# Patient Record
Sex: Female | Born: 1970 | Race: White | Hispanic: No | Marital: Single | Smoking: Never smoker
Health system: Southern US, Community
[De-identification: ages and names within clinical notes are randomized; demographics above are authoritative.]

## PROBLEM LIST (undated history)

## (undated) ENCOUNTER — Inpatient Hospital Stay (HOSPITAL_COMMUNITY): Payer: Medicaid Other

## (undated) DIAGNOSIS — E162 Hypoglycemia, unspecified: Secondary | ICD-10-CM

## (undated) DIAGNOSIS — N289 Disorder of kidney and ureter, unspecified: Secondary | ICD-10-CM

## (undated) DIAGNOSIS — K589 Irritable bowel syndrome without diarrhea: Secondary | ICD-10-CM

## (undated) DIAGNOSIS — M1990 Unspecified osteoarthritis, unspecified site: Secondary | ICD-10-CM

## (undated) DIAGNOSIS — D649 Anemia, unspecified: Secondary | ICD-10-CM

## (undated) HISTORY — PX: TUBAL LIGATION: SHX77

## (undated) HISTORY — PX: MOUTH SURGERY: SHX715

## (undated) HISTORY — PX: ABDOMINAL HYSTERECTOMY: SHX81

## (undated) HISTORY — DX: Unspecified osteoarthritis, unspecified site: M19.90

## (undated) HISTORY — DX: Irritable bowel syndrome, unspecified: K58.9

---

## 2001-03-04 ENCOUNTER — Emergency Department (HOSPITAL_COMMUNITY): Admission: EM | Admit: 2001-03-04 | Discharge: 2001-03-04 | Payer: Self-pay | Admitting: Emergency Medicine

## 2001-07-28 ENCOUNTER — Other Ambulatory Visit: Admission: RE | Admit: 2001-07-28 | Discharge: 2001-07-28 | Payer: Self-pay | Admitting: Obstetrics and Gynecology

## 2003-05-28 ENCOUNTER — Emergency Department (HOSPITAL_COMMUNITY): Admission: EM | Admit: 2003-05-28 | Discharge: 2003-05-28 | Payer: Self-pay | Admitting: Emergency Medicine

## 2003-05-28 ENCOUNTER — Encounter: Payer: Self-pay | Admitting: Emergency Medicine

## 2005-07-14 ENCOUNTER — Emergency Department (HOSPITAL_COMMUNITY): Admission: EM | Admit: 2005-07-14 | Discharge: 2005-07-14 | Payer: Self-pay | Admitting: Emergency Medicine

## 2006-04-03 ENCOUNTER — Emergency Department (HOSPITAL_COMMUNITY): Admission: EM | Admit: 2006-04-03 | Discharge: 2006-04-03 | Payer: Self-pay | Admitting: Emergency Medicine

## 2007-04-24 ENCOUNTER — Ambulatory Visit: Payer: Self-pay | Admitting: Orthopedic Surgery

## 2007-06-04 ENCOUNTER — Emergency Department (HOSPITAL_COMMUNITY): Admission: EM | Admit: 2007-06-04 | Discharge: 2007-06-04 | Payer: Self-pay | Admitting: Emergency Medicine

## 2007-07-20 ENCOUNTER — Encounter: Payer: Self-pay | Admitting: Orthopedic Surgery

## 2007-07-20 ENCOUNTER — Ambulatory Visit (HOSPITAL_COMMUNITY): Admission: RE | Admit: 2007-07-20 | Discharge: 2007-07-20 | Payer: Self-pay | Admitting: Orthopaedic Surgery

## 2007-08-24 ENCOUNTER — Emergency Department (HOSPITAL_COMMUNITY): Admission: EM | Admit: 2007-08-24 | Discharge: 2007-08-24 | Payer: Self-pay | Admitting: Emergency Medicine

## 2007-09-27 ENCOUNTER — Inpatient Hospital Stay (HOSPITAL_COMMUNITY): Admission: RE | Admit: 2007-09-27 | Discharge: 2007-09-29 | Payer: Self-pay | Admitting: Obstetrics & Gynecology

## 2007-09-27 ENCOUNTER — Encounter: Payer: Self-pay | Admitting: Obstetrics & Gynecology

## 2009-12-18 DIAGNOSIS — J45909 Unspecified asthma, uncomplicated: Secondary | ICD-10-CM | POA: Insufficient documentation

## 2010-01-13 ENCOUNTER — Ambulatory Visit: Payer: Self-pay | Admitting: Orthopedic Surgery

## 2010-01-13 DIAGNOSIS — M2240 Chondromalacia patellae, unspecified knee: Secondary | ICD-10-CM | POA: Insufficient documentation

## 2010-01-13 DIAGNOSIS — M25569 Pain in unspecified knee: Secondary | ICD-10-CM | POA: Insufficient documentation

## 2010-01-16 ENCOUNTER — Telehealth (INDEPENDENT_AMBULATORY_CARE_PROVIDER_SITE_OTHER): Payer: Self-pay | Admitting: *Deleted

## 2010-01-26 ENCOUNTER — Encounter (HOSPITAL_COMMUNITY): Admission: RE | Admit: 2010-01-26 | Discharge: 2010-02-25 | Payer: Self-pay | Admitting: Orthopedic Surgery

## 2010-01-29 ENCOUNTER — Encounter: Payer: Self-pay | Admitting: Orthopedic Surgery

## 2010-04-15 ENCOUNTER — Encounter: Payer: Self-pay | Admitting: Orthopedic Surgery

## 2011-01-04 ENCOUNTER — Encounter: Payer: Self-pay | Admitting: Emergency Medicine

## 2011-01-12 NOTE — Miscellaneous (Signed)
Summary: PT order  PT order   Imported By: Cammie Sickle 01/14/2010 19:38:56  _____________________________________________________________________  External Attachment:    Type:   Image     Comment:   External Document

## 2011-01-12 NOTE — Progress Notes (Signed)
Summary: approved for celebrex  Phone Note Outgoing Call Call back at [phone]   Summary of Call: had to precert patient to have celebrex through [place] medicaid, approved for 6 months, faxed approval to [facility] in [place].

## 2011-01-12 NOTE — Progress Notes (Signed)
Summary: Office Visit  Office Visit   Imported By: Jacklynn Ganong 12/18/2009 16:25:43  _____________________________________________________________________  External Attachment:    Type:   Image     Comment:   External Document

## 2011-01-12 NOTE — Letter (Signed)
Summary: History form  History form   Imported By: Jacklynn Ganong 01/15/2010 16:23:46  _____________________________________________________________________  External Attachment:    Type:   Image     Comment:   External Document

## 2011-01-12 NOTE — Letter (Signed)
Summary: *Orthopedic Consult Note  [facility]  [address]  [place], [place] [address]   Phone: [phone]  Fax: [phone]    Re:    Bailey Mcdonald DOB:    12/11/1971   Dear: Dr. Regino Schultze   Thank you for requesting that we see the above patient for consultation.  A copy of the detailed office note will be sent under separate cover, for your review.  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  Physical therapy for 6 weeks.       Thank you for this opportunity to look after your patient.  Sincerely,   Terrance Mass. MD.

## 2011-01-12 NOTE — Medication Information (Signed)
Summary: Tax adviser   Imported By: Cammie Sickle 01/14/2010 19:35:13  _____________________________________________________________________  External Attachment:    Type:   Image     Comment:   External Document

## 2011-01-12 NOTE — Assessment & Plan Note (Signed)
Summary: KNEE PAIN RT>LT/NO RECENT XR /SARAH BRUCE/BSF   Visit Type:  Initial Consult Referring Provider:  Dr. Regino Schultze  CC:  right knee pain.  History of Present Illness: this is a 40 year old female who's had pain in her knee for over 7 years without any major injury.  No previous surgery.  She has anterior knee pain which was partially and adequately relieved by Celebrex but insurance would not continue to cover it.  She was switched to Mobic which burned her stomach and she also fell naproxen therapy  She has anterior knee pain with slight medial radiation.  There is crepitation noted on range of motion and she has difficulty going down stairs.    Allergies: No Known Drug Allergies  Past History:  Past Medical History: Last updated: 12/18/2009 Asthma  Past Surgical History: Last updated: 12/18/2009 Oral surgery Tubal  Review of Systems Resp:  Denies short of breath, difficulty breathing, COPD, cough, and pneumonia; wheezing. MS:  Complains of joint pain and joint swelling; denies rheumatoid arthritis, gout, bone cancer, osteoporosis, and ; instability,redness,heat.  The review of systems is negative for General, Cardiac , GI, GU, Neuro, Endo, Psych, Derm, EENT, Immunology, and Lymphatic.  Physical Exam  Msk:  vital signs are stable  The patient is well developed and nourished, with normal grooming and hygiene. The body habitus is  Endomorphic Pulses:  The pulses and perfusion were normal with normal color, temperature  and no swelling  Extremities:  lower extremities  Gait pattern is normal  Inspection of both knees normal  Range of motion both knees normal  Motor exam normal in both lower extremities  Both knees stable.  RIGHT knee crepitance with positive patellofemoral compression test negative on the LEFT with mild crepitus  Both hips painless range of motion  Leg lengths were equal.   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 1 q day  Orders: New Patient Level III (30865)  Problem # 2:  KNEE PAIN (ICD-719.46)  Her updated medication list for this problem includes:    Celebrex 200 Mg Caps (Celecoxib) .Marland Kitchen... 1 q day [facility] and lateral and oblique films of the knee showed no abnormalities  She also had a MRI of the knee which showed some fluid in chondromalacia patella no meniscal tears  Recommend physical therapy, Celebrex resubmitted for insurance clearance based on failure of nonsteroidal anti-inflammatories  Orders: New Patient Level III (78469)  Medications Added to Medication List This Visit: 1)  Celebrex 200 Mg Caps (Celecoxib) .Marland Kitchen.. 1 q day  Patient Instructions: 1)  You have received an injection of cortisone today. You may experience increased pain at the injection site. Apply ice pack to the area for 20 minutes every 2 hours and take 2 xtra strength tylenol every 8 hours. This increased pain will usually resolve in 24 hours. The injection will take effect in 3-10 days.  2)  pt x 6 weeks  3)  Please schedule a follow-up appointment as needed. Prescriptions: CELEBREX 200 MG CAPS (CELECOXIB) 1 q day  #90 x 5   Entered and Authorized by:   [name] MD  Signed by:   [name] MD on 01/13/2010   Method used:   Handwritten   RxID:   [id number]

## 2011-01-12 NOTE — Miscellaneous (Signed)
Summary: Discharged from Rehab  Discharged from Rehab   Imported By: Jacklynn Ganong 04/20/2010 10:18:57  _____________________________________________________________________  External Attachment:    Type:   Image     Comment:   External Document

## 2011-01-12 NOTE — Miscellaneous (Signed)
Summary: PT Initial evaluatin  PT Initial evaluatin   Imported By: Jacklynn Ganong 01/29/2010 16:48:07  _____________________________________________________________________  External Attachment:    Type:   Image     Comment:   External Document

## 2011-01-14 ENCOUNTER — Ambulatory Visit: Admit: 2011-01-14 | Payer: Self-pay | Admitting: Gastroenterology

## 2011-01-14 ENCOUNTER — Ambulatory Visit: Payer: Self-pay | Admitting: Gastroenterology

## 2011-04-27 NOTE — Discharge Summary (Signed)
NAMELorelle Mcdonald               ACCOUNT NO.:  [id number]   MEDICAL RECORD NO.:  [id number]          PATIENT TYPE:  INP   LOCATION:  A320                          FACILITY:  [facility]   PHYSICIAN:  Lazaro Arms, M.D.   DATE OF BIRTH:  Jan 26, 1971   DATE OF ADMISSION:  09/27/2007  DATE OF DISCHARGE:  10/17/2008LH                               DISCHARGE SUMMARY   DISCHARGE DIAGNOSIS:  1. Status post abdominal hysteroscopy and bilateral salpingo-      oophorectomy.  2. Unremarkable postoperative course.   PROCEDURE:  TAH and BSO.   Please refer to the history and physical as well as the operative report  for details of admission to the hospital.   HOSPITAL COURSE:  The patient was admitted postoperatively.  Intraoperative course was unremarkable.  She tolerated clear liquids and  a regular diet.  She voided without symptoms and was extensively  ambulatory.  Her abdominal examination was benign.  Her incision was  clean, dry, and intact.  Her hemoglobin and hematocrit were good  postoperative at 11 and 33, white count 10,000.  She was discharged to  home on Percocet, Motrin, and Cipro.  She is given instructions and  precautions to return to the office prior to her scheduled appointment  which is October 04, 2007.      Lazaro Arms, M.D.  Electronically Signed     LHE/MEDQ  D:  09/29/2007  T:  09/30/2007  Job:  [id number]

## 2011-04-27 NOTE — Op Note (Signed)
NAMELorelle Mcdonald               ACCOUNT NO.:  [id number]   MEDICAL RECORD NO.:  [id number]          PATIENT TYPE:  INP   LOCATION:  A320                          FACILITY:  [facility]   PHYSICIAN:  Lazaro Arms, M.D.   DATE OF BIRTH:  1971-04-06   DATE OF PROCEDURE:  09/27/2007  DATE OF DISCHARGE:                               OPERATIVE REPORT   PREOPERATIVE DIAGNOSES:  1. Right ovarian cyst.  2. Enlarged fibroid uterus.  3. Menometrorrhagia.   POSTOPERATIVE DIAGNOSES:  1. Right ovarian cyst.  2. Enlarged fibroid uterus.  3. Menometrorrhagia.   PROCEDURE:  Total abdominal hysterectomy/bilateral salpingo-  oophorectomy.   SURGEON:  Lazaro Arms, M.D.   ANESTHESIA:  General endotracheal.   FINDINGS:  Patient had a large simple cystic lesion of the right ovary.  It was measured by CT scan of 12 x 6 x 5, and that was consistent.  There were no peritoneal abnormalities otherwise, no omental studding,  peritoneal studding.  There were no excrescences.  The left ovary was  fine.  This appeared to be a benign lesion.  She had an enlarged fibroid  uterus.   DESCRIPTION OF OPERATION:  Patient was taken to the operating room,  placed in a supine position.  Underwent general endotracheal anesthesia.  Prepped and draped in the usual sterile fashion.  Foley catheter was  placed.  A Pfannenstiel skin incision was made, carried down sharply to  the rectus fascia, scored in the midline, extended laterally.  Fascia  was taken off the muscle superiorly and inferiorly without difficulty.  The muscles were divided.  The peritoneal cavity was entered.  The right  ovary was identified, and it was removed.  The pedicle was clamped, cut,  and double suture ligated with good hemostasis.  I then placed an Alexis  self-retaining wound retractor.  The upper abdomen was packed away.  The  uterine cornua were grasped.  The left round ligament was suture ligated  and cut. The right round ligament was  suture ligated and cut.  The  infundibulopelvic ligament on the right was clamped, cut, and double  suture ligated.  The infundibulopelvic ligament on the left was clamped,  cut, and double suture ligated.  Vesicouterine serosal flap was created  and pushed off the lower uterine segment.  The uterine vessels were  clamped, cut, and suture ligated.  Serial pedicles were taken down the  cervix, each pedicle being clamped, cut, and suture ligated.  Through  the cardinal ligament, the vagina was cross-clamped.  Specimen was  removed.  Vaginal angle sutures placed.  The vagina was closed with  interrupted figure-of-eight sutures.  The pelvis was irrigated  vigorously.  All pedicles were found to be hemostatic.  Interseed was  placed to prevent postoperative adhesions.  An Alexis wound retractor  was removed as well as the packs, after the pelvis was irrigated  vigorously.  The muscles and peritoneum were reapproximated loosely.  The fascia was closed using 0 Vicryl running  subcutaneous tissues, made hemostatic and irrigated.  The skin was  closed using skin staples.  The  patient tolerated the procedure well.  She experienced 150 cc of blood loss, was taken to the recovery room in  good, stable condition.  All counts were correct x3.      Lazaro Arms, M.D.  Electronically Signed     LHE/MEDQ  D:  09/27/2007  T:  09/27/2007  Job:  [id number]

## 2011-04-29 ENCOUNTER — Other Ambulatory Visit (HOSPITAL_COMMUNITY): Payer: Self-pay | Admitting: Internal Medicine

## 2011-04-29 ENCOUNTER — Encounter (HOSPITAL_COMMUNITY): Payer: Self-pay

## 2011-04-29 ENCOUNTER — Ambulatory Visit (HOSPITAL_COMMUNITY)
Admission: RE | Admit: 2011-04-29 | Discharge: 2011-04-29 | Disposition: A | Discharge status: Home / Self Care | Payer: Medicaid Other | Source: Ambulatory Visit | Attending: Internal Medicine | Admitting: Internal Medicine

## 2011-04-29 DIAGNOSIS — R0789 Other chest pain: Secondary | ICD-10-CM | POA: Insufficient documentation

## 2011-04-29 DIAGNOSIS — R079 Chest pain, unspecified: Secondary | ICD-10-CM

## 2011-05-14 HISTORY — PX: KNEE SURGERY: SHX244

## 2011-05-19 ENCOUNTER — Encounter: Payer: Self-pay | Admitting: Orthopedic Surgery

## 2011-05-19 ENCOUNTER — Ambulatory Visit (INDEPENDENT_AMBULATORY_CARE_PROVIDER_SITE_OTHER): Payer: Medicaid Other | Admitting: Orthopedic Surgery

## 2011-05-19 DIAGNOSIS — M2240 Chondromalacia patellae, unspecified knee: Secondary | ICD-10-CM

## 2011-05-19 DIAGNOSIS — M2241 Chondromalacia patellae, right knee: Secondary | ICD-10-CM | POA: Insufficient documentation

## 2011-05-19 NOTE — Progress Notes (Signed)
History and physical  Office visit note  CC: right knee pain.  History of Present Illness:  this is a 40 year old female who's had pain in her knee for over 7 years without any major injury. No previous surgery. She has anterior knee pain which was partially and adequately relieved by Celebrex but insurance would not continue to cover it. She was switched to Mobic which burned her stomach and she also failed naproxen therapy  She has anterior knee pain with slight medial radiation. There is crepitation noted on range of motion and she has difficulty going down stairs. Had increasing pain and things feel like they are getting worse.  She still has a crepitation which seems to be getting worse and the knee seemed to give out and has some catching in it.  She failed nonoperative treat.  We discussed possible repeat physical therapy which she is already done, repeat injection or continue anti-inflammatories but at this point I think she has failed a reasonable course of nonoperative therapy.  She understands she may have some anterior knee pain after this but she is willing to take that risk.   Allergies:  No Known Drug Allergies  Past History:  Past Medical History:    Asthma  Past Surgical History:    Oral surgery  Tubal   Review of Systems  Resp: Denies short of breath, difficulty breathing, COPD, cough, and pneumonia; wheezing.  MS: Complains of joint pain and joint swelling; denies rheumatoid arthritis, gout, bone cancer, osteoporosis, and ; instability,redness,heat.  The review of systems is negative for General, Cardiac , GI, GU, Neuro, Endo, Psych, Derm, EENT, Immunology, and Lymphatic.   Physical Exam  Msk: vital signs are stable  The patient is well developed and nourished, with normal grooming and hygiene. The body habitus is Endomorphic  Pulses: The pulses and perfusion were normal with normal color, temperature and no swelling  Extremities: lower extremities  Gait pattern  is normal  Inspection of both knees normal  Range of motion both knees normal  Motor exam normal in both lower extremities  Both knees stable.  RIGHT knee crepitance with positive patellofemoral compression test negative on the LEFT with mild crepitus  Both hips painless range of motion  Leg lengths were equal.  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  This procedure has been fully reviewed with the patient and written informed consent has been obtained.

## 2011-05-19 NOTE — Progress Notes (Signed)
A separate identifiable x-ray report 3 views RIGHT knee  Reason pain knee  AP lateral patellofemoral view shows normal alignment, no significant joint space narrowing.  No fracture dislocation.  Impression normal knee

## 2011-05-26 ENCOUNTER — Telehealth: Payer: Self-pay | Admitting: Orthopedic Surgery

## 2011-05-26 NOTE — Telephone Encounter (Signed)
Bailey Mcdonald is scheduled for right knee surgery 06/04/11.  Has received a notice to serve on jury duty starting 06/21/11.  She asked if you would write a letter asking for her to be excused due to the scheduled surgery.

## 2011-05-27 ENCOUNTER — Encounter: Payer: Self-pay | Admitting: Orthopedic Surgery

## 2011-05-27 NOTE — Telephone Encounter (Signed)
Write letter

## 2011-05-27 NOTE — Telephone Encounter (Signed)
Requested letter to be excused from jury duty approved and written.  Notified patient, left message [phone].

## 2011-05-31 ENCOUNTER — Telehealth: Payer: Self-pay | Admitting: Orthopedic Surgery

## 2011-05-31 ENCOUNTER — Encounter (HOSPITAL_COMMUNITY)
Admission: RE | Admit: 2011-05-31 | Discharge: 2011-05-31 | Disposition: A | Discharge status: Home / Self Care | Payer: Medicaid Other | Source: Ambulatory Visit | Attending: Orthopedic Surgery | Admitting: Orthopedic Surgery

## 2011-05-31 LAB — BASIC METABOLIC PANEL
CO2: 30 mEq/L (ref 19–32)
Chloride: 105 mEq/L (ref 96–112)
Potassium: 4.1 mEq/L (ref 3.5–5.1)
Sodium: 141 mEq/L (ref 135–145)

## 2011-05-31 LAB — SURGICAL PCR SCREEN
MRSA, PCR: NEGATIVE
Staphylococcus aureus: NEGATIVE

## 2011-05-31 LAB — HEMOGLOBIN AND HEMATOCRIT, BLOOD: HCT: 42 % (ref 36.0–46.0)

## 2011-05-31 NOTE — Telephone Encounter (Signed)
Contacted insurer's (Medicaid) authorization contact, [employer], ph [phone] re: pre-certification information, out-patient surgery scheduled 06/04/11 at [facility]. Per automated system, CPT codes 09811, [phone], [phone], no pre-authorization required.

## 2011-06-04 ENCOUNTER — Ambulatory Visit (HOSPITAL_COMMUNITY)
Admission: RE | Admit: 2011-06-04 | Discharge: 2011-06-04 | Disposition: A | Discharge status: Home / Self Care | Payer: Medicaid Other | Source: Ambulatory Visit | Attending: Orthopedic Surgery | Admitting: Orthopedic Surgery

## 2011-06-04 DIAGNOSIS — Z01812 Encounter for preprocedural laboratory examination: Secondary | ICD-10-CM | POA: Insufficient documentation

## 2011-06-04 DIAGNOSIS — M2240 Chondromalacia patellae, unspecified knee: Secondary | ICD-10-CM | POA: Insufficient documentation

## 2011-06-04 DIAGNOSIS — M1710 Unilateral primary osteoarthritis, unspecified knee: Secondary | ICD-10-CM

## 2011-06-07 ENCOUNTER — Ambulatory Visit (INDEPENDENT_AMBULATORY_CARE_PROVIDER_SITE_OTHER): Payer: Medicaid Other | Admitting: Orthopedic Surgery

## 2011-06-07 DIAGNOSIS — M2240 Chondromalacia patellae, unspecified knee: Secondary | ICD-10-CM

## 2011-06-07 NOTE — Progress Notes (Signed)
   Postop visit #1  Date of surgery June 22  Postop day 3  [facility]  Chondroplasty patella for chondromalacia grade 3  The knee looks good with minimal swelling however she only has about 60 of flexion.  Pain medication Norco 7.5  Advised her to continue heel slides and start therapy on Thursday for followup 3 weeks

## 2011-06-07 NOTE — Patient Instructions (Signed)
Start PT [facility]  Come back in 3 weeks recheck right knee post op 2

## 2011-06-10 ENCOUNTER — Ambulatory Visit (HOSPITAL_COMMUNITY)
Admit: 2011-06-10 | Discharge: 2011-06-10 | Disposition: A | Discharge status: Home / Self Care | Payer: Medicaid Other | Source: Ambulatory Visit | Attending: Orthopedic Surgery | Admitting: Orthopedic Surgery

## 2011-06-10 DIAGNOSIS — M6281 Muscle weakness (generalized): Secondary | ICD-10-CM | POA: Insufficient documentation

## 2011-06-10 DIAGNOSIS — M25569 Pain in unspecified knee: Secondary | ICD-10-CM | POA: Insufficient documentation

## 2011-06-10 DIAGNOSIS — R262 Difficulty in walking, not elsewhere classified: Secondary | ICD-10-CM | POA: Insufficient documentation

## 2011-06-10 DIAGNOSIS — M25669 Stiffness of unspecified knee, not elsewhere classified: Secondary | ICD-10-CM | POA: Insufficient documentation

## 2011-06-10 DIAGNOSIS — IMO0001 Reserved for inherently not codable concepts without codable children: Secondary | ICD-10-CM | POA: Insufficient documentation

## 2011-06-18 ENCOUNTER — Ambulatory Visit (HOSPITAL_COMMUNITY): Payer: Medicaid Other | Admitting: *Deleted

## 2011-06-24 ENCOUNTER — Telehealth (HOSPITAL_COMMUNITY): Payer: Self-pay | Admitting: *Deleted

## 2011-06-24 ENCOUNTER — Ambulatory Visit (HOSPITAL_COMMUNITY): Payer: Medicaid Other | Admitting: *Deleted

## 2011-06-28 NOTE — Op Note (Signed)
  NAMELorelle Mcdonald               ACCOUNT NO.:  [id number]  MEDICAL RECORD NO.:  [id number]  LOCATION:  DAYP                          FACILITY:  [facility]  PHYSICIAN:  Vickki Hearing, M.D.DATE OF BIRTH:  06/13/1971  DATE OF PROCEDURE:  06/04/2011 DATE OF DISCHARGE:                              OPERATIVE REPORT   HISTORY:  This is a 40 year old female with long history of the anterior knee pain, confirmed by MRI.  She was treated with anti-inflammatories, analgesics, physical therapy, activity modification, and continued to have anterior knee pain with crepitance.  She was counseled about her disease process given various options including nonoperative treatment versus debridement of the patella.  She opted for surgical debridement.  She understood that she may continued to have some anterior knee pain as debrided cartilage will not grow back.  She was willing to take that her risk.  PREOPERATIVE DIAGNOSIS:  Chondromalacia patella, right knee.  POSTOPERATIVE DIAGNOSIS:  Chondromalacia patella, right knee.  PROCEDURE IN DETAIL:  Arthroscopy, right knee and chondroplasty of the patella.  SURGEON:  Vickki Hearing, MD  ASSISTANTS:  None.  ANESTHESIA:  General with LMA.  OPERATIVE FINDINGS:  Chondromalacia primarily at the lateral facet. Mild chondromalacia of the medial facet.  Trochlear normal.  Medial and lateral compartments normal.  ACL and PCL normal.  DETAILS OF PROCEDURE:  The right knee was marked as the surgical site, countersigned by the surgeon.  During the preoperative update, the patient had a cat scratch which was approximately 6 days old on the lateral side of the knee proximal to the patella.  This was noted and marked.  Chart updating was completed and she was taken to surgery.  She had a gram of antibiotics.  She was intubated with an LMA.  Her right leg was placed in a well leg holder.  The left leg was placed in a padded leg holder.  After  sterile prep and drape, the lateral portal was established.  The scope was introduced into the medial compartment. Diagnostic arthroscopy commenced there.  After completing the diagnostic arthroscopy and finding the chondromalacia of the patella, we placed a medial portal using a spinal needle to access the patellofemoral cartilage.  We then placed a shaver and did a debridement of the patella.  We then placed a [employer] ArthroCare wand and completed the chondroplasty.  We irrigated the knee twice and did a repeat diagnostic arthroscopy to evaluate the menisci one more time.  They were found to be normal.  We closed with 3-0 nylon and injected 60 mL of Marcaine into the joint. We applied sterile dressings, activated the cryo cuff over an Ace bandage and then the patient was extubated and taken to recovery room in stable condition.  She will follow up on Monday.  She was given some Norco 7.5 mg for pain.     Vickki Hearing, M.D.     SEH/MEDQ  D:  06/04/2011  T:  06/05/2011  Job:  [id number]  Electronically Signed by [name] M.D. on 06/28/2011 05:53:31 PM

## 2011-06-30 ENCOUNTER — Ambulatory Visit (INDEPENDENT_AMBULATORY_CARE_PROVIDER_SITE_OTHER): Payer: Medicaid Other | Admitting: Orthopedic Surgery

## 2011-06-30 DIAGNOSIS — M2240 Chondromalacia patellae, unspecified knee: Secondary | ICD-10-CM

## 2011-06-30 NOTE — Patient Instructions (Signed)
START CARTILAGE SUPPLEMENTS  Glucosamine Chondroitin   OR   OSTEOBIFLEX

## 2011-06-30 NOTE — Progress Notes (Signed)
  Postop visit    Procedure Arthroscopy RIGHT knee chondroplasty patella  Diagnosis Patella chondral malacia  Findings Degeneration of the cartilage with grade 2 and 3 changes of the patellofemoral cartilage  Pain medication Not applicable  Appearance of knee The knee looks good today.  There is still crepitance but no pain with range of motion.  Physical therapy will be done at  [facility]   Patient complaints Stiffness  Plan Return for evaluation both knees LEFT knee started to bother her.  Recommended activity modification, cartilage supplementation .

## 2011-07-01 ENCOUNTER — Inpatient Hospital Stay (HOSPITAL_COMMUNITY): Admission: RE | Admit: 2011-07-01 | Payer: Medicaid Other | Source: Ambulatory Visit | Admitting: *Deleted

## 2011-08-11 ENCOUNTER — Ambulatory Visit: Payer: Medicaid Other | Admitting: Orthopedic Surgery

## 2011-09-09 ENCOUNTER — Ambulatory Visit (INDEPENDENT_AMBULATORY_CARE_PROVIDER_SITE_OTHER): Payer: Medicaid Other | Admitting: Orthopedic Surgery

## 2011-09-09 ENCOUNTER — Encounter: Payer: Self-pay | Admitting: Orthopedic Surgery

## 2011-09-09 DIAGNOSIS — M2240 Chondromalacia patellae, unspecified knee: Secondary | ICD-10-CM

## 2011-09-09 DIAGNOSIS — M1710 Unilateral primary osteoarthritis, unspecified knee: Secondary | ICD-10-CM

## 2011-09-09 NOTE — Progress Notes (Signed)
Problem LEFT knee pain  The patient is status post arthroscopy RIGHT knee with patellar chondroplasty for chondromalacia of the patella in June of 2012.  Presents now with pain and crepitance in the LEFT knee with difficulty kneeling and climbing squatting bending.  She would like to proceed with LEFT knee arthroscopy and debridement.  Review of systems: No history of fever chills or fatigue, eyes no blurred vision, no difficulty swallowing, no chest pain, no shortness of breath, no heartburn, no frequency, no itching or rash, no numbness or tingling, no nervousness, no easy bleeding no excessive thirst and no food reactions.  Past Medical History  Diagnosis Date  . Asthma   . Arthritis   . IBS (irritable bowel syndrome)     Past Surgical History  Procedure Date  . Mouth surgery   . Tubal ligation   . Knee surgery 05/2011    rtight knee scope chondroplasty patella    History   Social History  . Marital Status: Single    Spouse Name: N/A    Number of Children: N/A  . Years of Education: N/A   Occupational History  . property management    Social History Main Topics  . Smoking status: Never Smoker   . Smokeless tobacco: Not on file  . Alcohol Use: No  . Drug Use: No  . Sexually Active: Not on file   Other Topics Concern  . Not on file   Social History Narrative  . No narrative on file     Exam we have stable vital signs We have normal appearance and endomorphic body habitus We are oriented x3 today with pleasant mood  We have normal gait analysis  The LEFT knee is tender and painful in the patella with patellofemoral crepitance, range of motion is normal, the knee is stable, the patella has good mobility  The strength in the knee is normal the skin is intact.  Her upper extremities show no contracture subluxation atrophy tremor or malalignment  All 4 limbs have normal scan  Pulse and temperature are normal in the extremities without edema swelling or  varicosities.  There is no lymphadenopathy.  Sensation remains intact.  There are no pathologic reflexes  Coordination and balance are normal  Imaging 3 views of the knee show normal patellofemoral alignment normal knee alignment and no arthritis in the tibial femoral articulation  Assessment patellofemoral chondromalacia.  We both agree that nonoperative treatment is not warranted at this time.  We tried that with her other knee and it didn't work her knee got worse and she experience quite a bit of pain and discomfort and loss of function prior to the surgery  She also has a ganglion cyst on the LEFT wrist and is considering having that removed and we would send her to Dr. Malvin Johns for that.   Plan arthroscopy LEFT knee chondroplasty of the patella.

## 2011-09-09 NOTE — Patient Instructions (Signed)
You have been scheduled for surgery.  All surgeries carry some risk.  Remember you always have the option of continued nonsurgical treatment. However in this situation the risks vs. the benefits favor surgery as the best treatment option. The risks of the surgery includes the following but is not limited to bleeding, infection, pulmonary embolus, death from anesthesia, nerve injury vascular injury or need for further surgery, continued pain.  Specific to this procedure the following risks and complications are rare but possible  Pain  Swelling  Crepitance (knee makes noise)

## 2011-09-13 ENCOUNTER — Encounter (HOSPITAL_COMMUNITY)
Admission: RE | Admit: 2011-09-13 | Discharge: 2011-09-13 | Disposition: A | Discharge status: Home / Self Care | Payer: Medicaid Other | Source: Ambulatory Visit | Attending: Orthopedic Surgery | Admitting: Orthopedic Surgery

## 2011-09-13 ENCOUNTER — Encounter (HOSPITAL_COMMUNITY): Payer: Self-pay

## 2011-09-13 HISTORY — DX: Anemia, unspecified: D64.9

## 2011-09-13 HISTORY — DX: Hypoglycemia, unspecified: E16.2

## 2011-09-13 LAB — DIFFERENTIAL
Basophils Absolute: 0.1 10*3/uL (ref 0.0–0.1)
Basophils Relative: 1 % (ref 0–1)
Eosinophils Absolute: 0.3 10*3/uL (ref 0.0–0.7)
Lymphs Abs: 2.4 10*3/uL (ref 0.7–4.0)
Neutrophils Relative %: 56 % (ref 43–77)

## 2011-09-13 LAB — CBC
MCH: 30 pg (ref 26.0–34.0)
MCHC: 34.2 g/dL (ref 30.0–36.0)
Platelets: 258 10*3/uL (ref 150–400)
RBC: 4.96 MIL/uL (ref 3.87–5.11)
RDW: 12.4 % (ref 11.5–15.5)

## 2011-09-13 LAB — BASIC METABOLIC PANEL
GFR calc Af Amer: >90 mL/min (ref >90)
GFR calc non Af Amer: 88 mL/min — ABNORMAL LOW (ref >90)
Potassium: 4.1 mEq/L (ref 3.5–5.1)
Sodium: 140 mEq/L (ref 135–145)

## 2011-09-13 LAB — SURGICAL PCR SCREEN
MRSA, PCR: NEGATIVE
Staphylococcus aureus: NEGATIVE

## 2011-09-13 NOTE — Patient Instructions (Addendum)
18 Bailey Mcdonald  09/13/2011   Your procedure is scheduled on:  09/17/2011  Report to [facility] at  1000  AM.  Call this number if you have problems the morning of surgery: [phone]   Remember:   Do not eat food:After Midnight.  Do not drink clear liquids: After Midnight.  Take these medicines the morning of surgery with A SIP OF WATER: singulair,bentyl. Take albuterol before you come.   Do not wear jewelry, make-up or nail polish.  Do not wear lotions, powders, or perfumes. You may wear deodorant.  Do not shave 48 hours prior to surgery.  Do not bring valuables to the hospital.  Contacts, dentures or bridgework may not be worn into surgery.  Leave suitcase in the car. After surgery it may be brought to your room.  For patients admitted to the hospital, checkout time is 11:00 AM the day of discharge.   Patients discharged the day of surgery will not be allowed to drive home.  Name and phone number of your driver: family  Special Instructions: CHG Shower Use Special Wash: 1/2 bottle night before surgery and 1/2 bottle morning of surgery.   Please read over the following fact sheets that you were given: Pain Booklet, MRSA Information, Surgical Site Infection Prevention, Anesthesia Post-op Instructions and Care and Recovery After Surgery PATIENT INSTRUCTIONS POST-ANESTHESIA  IMMEDIATELY FOLLOWING SURGERY:  Do not drive or operate machinery for the first twenty four hours after surgery.  Do not make any important decisions for twenty four hours after surgery or while taking narcotic pain medications or sedatives.  If you develop intractable nausea and vomiting or a severe headache please notify your doctor immediately.  FOLLOW-UP:  Please make an appointment with your surgeon as instructed. You do not need to follow up with anesthesia unless specifically instructed to do so.  WOUND CARE INSTRUCTIONS (if applicable):  Keep a dry clean dressing on the anesthesia/puncture wound site if  there is drainage.  Once the wound has quit draining you may leave it open to air.  Generally you should leave the bandage intact for twenty four hours unless there is drainage.  If the epidural site drains for more than 36-48 hours please call the anesthesia department.  QUESTIONS?:  Please feel free to call your physician or the hospital operator if you have any questions, and they will be happy to assist you.     [facility] Anesthesia Department [address] [place] [phone]

## 2011-09-15 ENCOUNTER — Telehealth: Payer: Self-pay | Admitting: Orthopedic Surgery

## 2011-09-15 NOTE — Telephone Encounter (Signed)
Contacted Medicaid's contracted authorization contact [employer], ph [phone], re: out-patient surgery scheduled 09/17/11 at [facility] [place] [address], [phone].  No prior authorization required per automated voice response system.

## 2011-09-16 ENCOUNTER — Telehealth: Payer: Self-pay | Admitting: Orthopedic Surgery

## 2011-09-16 NOTE — Telephone Encounter (Signed)
Patient called to relay that she needs to cancel surgery for tomorrow, 09/17/11; states has "come down with whatever bug is going around."  She said she can re-schedule for next Friday or whatever is recommended.  Her home ph# is [phone].

## 2011-09-16 NOTE — Telephone Encounter (Signed)
Dr. Romeo Apple called Day Surgery and gave message to Selena Batten about the cancellation due to illness.  I also spoke with Selena Batten and she will move up last case into this surgery slot.  Marchelle Folks is needing a call back about the possible re-schedule for next Friday.  Selena Batten said she would be okay with same pre-op if she has the surgery within the next 2 weeks.  Pt's ph is [phone].

## 2011-09-17 ENCOUNTER — Ambulatory Visit (HOSPITAL_COMMUNITY): Admission: RE | Admit: 2011-09-17 | Payer: Medicaid Other | Source: Ambulatory Visit | Admitting: Orthopedic Surgery

## 2011-09-17 ENCOUNTER — Telehealth: Payer: Self-pay | Admitting: *Deleted

## 2011-09-17 ENCOUNTER — Encounter (HOSPITAL_COMMUNITY): Admission: RE | Payer: Self-pay | Source: Ambulatory Visit

## 2011-09-17 SURGERY — ARTHROSCOPY, KNEE
Anesthesia: Choice | Site: Knee | Laterality: Left

## 2011-09-17 NOTE — Telephone Encounter (Signed)
Called patient, lmom to call back here

## 2011-09-17 NOTE — Telephone Encounter (Signed)
I rescheduled surgery for 09/24/11 was initially scheduled for 09/17/11 patient was sick, she will have a phone interview for preop since she had preop on 09/13/11 at the hospital, her post op appt is for 09/27/11 at 330, I left patient a message on machine with all of this information.

## 2011-09-20 ENCOUNTER — Ambulatory Visit: Payer: Medicaid Other | Admitting: Orthopedic Surgery

## 2011-09-21 ENCOUNTER — Encounter (HOSPITAL_COMMUNITY): Payer: Self-pay

## 2011-09-21 ENCOUNTER — Encounter (HOSPITAL_COMMUNITY)
Admission: RE | Admit: 2011-09-21 | Discharge: 2011-09-21 | Disposition: A | Discharge status: Home / Self Care | Payer: Medicaid Other | Source: Ambulatory Visit | Attending: Orthopedic Surgery | Admitting: Orthopedic Surgery

## 2011-09-22 LAB — DIFFERENTIAL
Basophils Absolute: 0
Basophils Relative: 0
Eosinophils Absolute: 0.2
Eosinophils Relative: 2
Lymphocytes Relative: 14
Lymphs Abs: 1.6
Monocytes Absolute: 0.8 — ABNORMAL HIGH
Neutro Abs: 7.8 — ABNORMAL HIGH
Neutrophils Relative %: 76

## 2011-09-22 LAB — CBC
Hemoglobin: 11.1 — ABNORMAL LOW
MCHC: 34
Platelets: 209
Platelets: 216
RDW: 13.3
RDW: 13.3
WBC: 10.9 — ABNORMAL HIGH

## 2011-09-22 NOTE — H&P (Signed)
Description: 40 year old female  Provider: [name], MD  Department: [facility]        Diagnoses     Chondromalacia of patella     717.7    Patellofemoral arthritis     716.96      Reason for Visit   PRE-OP   eval leftl knee  [facility]ARK 06/04/11, needs left knee xray today.       Reason For Visit History Recorded        Vitals - Last Recorded       Ht Wt BMI    5\' 7"  (1.702 m)  220 lb (99.791 kg)  34.46 kg/m2          Progress Notes     [name], MD  09/09/2011  3:00 PM  Signed Problem LEFT knee pain   The patient is status post arthroscopy RIGHT knee with patellar chondroplasty for chondromalacia of the patella in June of 2012.   Presents now with pain and crepitance in the LEFT knee with difficulty kneeling and climbing squatting bending.   She would like to proceed with LEFT knee arthroscopy and debridement.   Review of systems: No history of fever chills or fatigue, eyes no blurred vision, no difficulty swallowing, no chest pain, no shortness of breath, no heartburn, no frequency, no itching or rash, no numbness or tingling, no nervousness, no easy bleeding no excessive thirst and no food reactions.    Past Medical History   Diagnosis  Date   .  Asthma     .  Arthritis     .  IBS (irritable bowel syndrome)         Past Surgical History   Procedure  Date   .  Mouth surgery     .  Tubal ligation     .  Knee surgery  05/2011       rtight knee scope chondroplasty patella       History       Social History   .  Marital Status:  Single       Spouse Name:  N/A       Number of Children:  N/A   .  Years of Education:  N/A       Occupational History   .  property management         Social History Main Topics   .  Smoking status:  Never Smoker    .  Smokeless tobacco:  Not on file   .  Alcohol Use:  No   .  Drug Use:  No   .  Sexually Active:  Not on file       Other Topics  Concern   .  Not on file       Social  History Narrative   .  No narrative on file        Exam we have stable vital signs We have normal appearance and endomorphic body habitus We are oriented x3 today with pleasant mood   We have normal gait analysis   The LEFT knee is tender and painful in the patella with patellofemoral crepitance, range of motion is normal, the knee is stable, the patella has good mobility   The strength in the knee is normal the skin is intact.   Her upper extremities show no contracture subluxation atrophy tremor or malalignment   All 4 limbs have normal scan   Pulse and temperature are normal in  the extremities without edema swelling or varicosities.   There is no lymphadenopathy.  Sensation remains intact.  There are no pathologic reflexes   Coordination and balance are normal   Imaging 3 views of the knee show normal patellofemoral alignment normal knee alignment and no arthritis in the tibial femoral articulation   Assessment patellofemoral chondromalacia.  We both agree that nonoperative treatment is not warranted at this time.  We tried that with her other knee and it didn't work her knee got worse and she experience quite a bit of pain and discomfort and loss of function prior to the surgery   She also has a ganglion cyst on the LEFT wrist and is considering having that removed and we would send her to Dr. Malvin Johns for that.     Plan arthroscopy LEFT knee chondroplasty of the patella.

## 2011-09-23 LAB — DIFFERENTIAL
Basophils Relative: 1
Eosinophils Absolute: 0.3
Eosinophils Relative: 5
Monocytes Absolute: 0.3
Monocytes Relative: 5

## 2011-09-23 LAB — CBC
Hemoglobin: 12.9
Platelets: 248
RDW: 13.6
WBC: 6.4

## 2011-09-23 LAB — COMPREHENSIVE METABOLIC PANEL
ALT: 18
Albumin: 3.7
Alkaline Phosphatase: 67
Potassium: 4.3
Sodium: 139
Total Protein: 6.4

## 2011-09-24 ENCOUNTER — Encounter (HOSPITAL_COMMUNITY): Payer: Self-pay | Admitting: Anesthesiology

## 2011-09-24 ENCOUNTER — Encounter (HOSPITAL_COMMUNITY)
Admission: RE | Disposition: A | Discharge status: Home / Self Care | Payer: Self-pay | Source: Ambulatory Visit | Attending: Orthopedic Surgery

## 2011-09-24 ENCOUNTER — Ambulatory Visit (HOSPITAL_COMMUNITY)
Admission: RE | Admit: 2011-09-24 | Discharge: 2011-09-24 | Disposition: A | Discharge status: Home / Self Care | Payer: Medicaid Other | Source: Ambulatory Visit | Attending: Orthopedic Surgery | Admitting: Orthopedic Surgery

## 2011-09-24 ENCOUNTER — Encounter (HOSPITAL_COMMUNITY): Payer: Self-pay | Admitting: *Deleted

## 2011-09-24 ENCOUNTER — Ambulatory Visit (HOSPITAL_COMMUNITY): Payer: Medicaid Other | Admitting: Anesthesiology

## 2011-09-24 DIAGNOSIS — Z01812 Encounter for preprocedural laboratory examination: Secondary | ICD-10-CM | POA: Insufficient documentation

## 2011-09-24 DIAGNOSIS — M2241 Chondromalacia patellae, right knee: Secondary | ICD-10-CM

## 2011-09-24 DIAGNOSIS — M2240 Chondromalacia patellae, unspecified knee: Secondary | ICD-10-CM

## 2011-09-24 DIAGNOSIS — Z01810 Encounter for preprocedural cardiovascular examination: Secondary | ICD-10-CM | POA: Insufficient documentation

## 2011-09-24 DIAGNOSIS — M1710 Unilateral primary osteoarthritis, unspecified knee: Secondary | ICD-10-CM | POA: Insufficient documentation

## 2011-09-24 HISTORY — PX: CHONDROPLASTY: SHX5177

## 2011-09-24 HISTORY — PX: KNEE ARTHROSCOPY: SHX127

## 2011-09-24 LAB — DIFFERENTIAL
Basophils Relative: 0
Eosinophils Absolute: 0.3
Monocytes Absolute: 0.5
Monocytes Relative: 6

## 2011-09-24 LAB — URINALYSIS, ROUTINE W REFLEX MICROSCOPIC
Glucose, UA: NEGATIVE
Hgb urine dipstick: NEGATIVE
Protein, ur: NEGATIVE
Specific Gravity, Urine: >1.03 — ABNORMAL HIGH

## 2011-09-24 LAB — BASIC METABOLIC PANEL
CO2: 29
Chloride: 110
GFR calc Af Amer: >60
Sodium: 141

## 2011-09-24 LAB — CBC
HCT: 37.2
Hemoglobin: 12.7
MCHC: 34.2
MCV: 83.3
RBC: 4.47

## 2011-09-24 SURGERY — ARTHROSCOPY, KNEE
Anesthesia: General | Site: Knee | Laterality: Left | Wound class: Clean

## 2011-09-24 MED ORDER — HYDROCODONE-ACETAMINOPHEN 5-325 MG PO TABS: 1.0000 | ORAL_TABLET | Freq: Once | ORAL | Status: DC

## 2011-09-24 MED ORDER — CEFAZOLIN SODIUM 1-5 GM-% IV SOLN: 1.0000 g | INTRAVENOUS | Status: DC

## 2011-09-24 MED ORDER — PROPOFOL 10 MG/ML IV EMUL
INTRAVENOUS | Status: DC | PRN
  Administered 2011-09-24: 150 mg via INTRAVENOUS

## 2011-09-24 MED ORDER — CEFAZOLIN SODIUM 1-5 GM-% IV SOLN
INTRAVENOUS | Status: DC | PRN
  Administered 2011-09-24: 1 g via INTRAVENOUS

## 2011-09-24 MED ORDER — LIDOCAINE HCL 1 % IJ SOLN
INTRAMUSCULAR | Status: DC | PRN
  Administered 2011-09-24: 30 mg via INTRADERMAL

## 2011-09-24 MED ORDER — GLYCOPYRROLATE 0.2 MG/ML IJ SOLN
0.2000 mg | Freq: Once | INTRAMUSCULAR | Status: AC | PRN
  Administered 2011-09-24: 0.2 mg via INTRAVENOUS

## 2011-09-24 MED ORDER — ONDANSETRON HCL 4 MG/2ML IJ SOLN
4.0000 mg | Freq: Once | INTRAMUSCULAR | Status: AC
  Administered 2011-09-24: 4 mg via INTRAVENOUS

## 2011-09-24 MED ORDER — FENTANYL CITRATE 0.05 MG/ML IJ SOLN
INTRAMUSCULAR | Status: AC
  Filled 2011-09-24: qty 5

## 2011-09-24 MED ORDER — ONDANSETRON HCL 4 MG/2ML IJ SOLN: 4.0000 mg | Freq: Once | INTRAMUSCULAR | Status: DC

## 2011-09-24 MED ORDER — CELECOXIB 100 MG PO CAPS: 400.0000 mg | ORAL_CAPSULE | Freq: Once | ORAL | Status: DC

## 2011-09-24 MED ORDER — HYDROCODONE-ACETAMINOPHEN 7.5-325 MG PO TABS: 1.0000 | ORAL_TABLET | Freq: Four times a day (QID) | ORAL | Status: DC | PRN

## 2011-09-24 MED ORDER — BUPIVACAINE-EPINEPHRINE PF 0.5-1:200000 % IJ SOLN
INTRAMUSCULAR | Status: DC | PRN
Start: 2011-09-24 — End: 2011-09-24
  Administered 2011-09-24: 60 mL

## 2011-09-24 MED ORDER — ONDANSETRON HCL 4 MG/2ML IJ SOLN: 4.0000 mg | Freq: Once | INTRAMUSCULAR | Status: DC | PRN

## 2011-09-24 MED ORDER — SODIUM CHLORIDE 0.9 % IR SOLN
Status: DC | PRN
  Administered 2011-09-24: 1000 mL

## 2011-09-24 MED ORDER — ACETAMINOPHEN 325 MG PO TABS: 325.0000 mg | ORAL_TABLET | ORAL | Status: DC | PRN

## 2011-09-24 MED ORDER — FENTANYL CITRATE 0.05 MG/ML IJ SOLN
25.0000 ug | INTRAMUSCULAR | Status: DC | PRN
  Administered 2011-09-24 (×2): 50 ug via INTRAVENOUS

## 2011-09-24 MED ORDER — CEFAZOLIN SODIUM 1-5 GM-% IV SOLN
INTRAVENOUS | Status: AC
  Filled 2011-09-24: qty 50

## 2011-09-24 MED ORDER — BUPIVACAINE-EPINEPHRINE PF 0.5-1:200000 % IJ SOLN
INTRAMUSCULAR | Status: AC
  Filled 2011-09-24: qty 20

## 2011-09-24 MED ORDER — GLYCOPYRROLATE 0.2 MG/ML IJ SOLN
INTRAMUSCULAR | Status: AC
  Filled 2011-09-24: qty 1

## 2011-09-24 MED ORDER — FENTANYL CITRATE 0.05 MG/ML IJ SOLN
INTRAMUSCULAR | Status: AC
  Filled 2011-09-24: qty 2

## 2011-09-24 MED ORDER — ONDANSETRON HCL 4 MG/2ML IJ SOLN
INTRAMUSCULAR | Status: AC
  Filled 2011-09-24: qty 2

## 2011-09-24 MED ORDER — LACTATED RINGERS IV SOLN
INTRAVENOUS | Status: DC
  Administered 2011-09-24: 1000 mL via INTRAVENOUS

## 2011-09-24 MED ORDER — ACETAMINOPHEN 500 MG PO TABS: 500.0000 mg | ORAL_TABLET | Freq: Once | ORAL | Status: DC

## 2011-09-24 MED ORDER — FENTANYL CITRATE 0.05 MG/ML IJ SOLN
INTRAMUSCULAR | Status: DC | PRN
  Administered 2011-09-24: 50 ug via INTRAVENOUS
  Administered 2011-09-24 (×3): 25 ug via INTRAVENOUS

## 2011-09-24 MED ORDER — MIDAZOLAM HCL 2 MG/2ML IJ SOLN
INTRAMUSCULAR | Status: AC
  Filled 2011-09-24: qty 2

## 2011-09-24 MED ORDER — SODIUM CHLORIDE 0.9 % IR SOLN
Status: DC | PRN
  Administered 2011-09-24: 09:00:00

## 2011-09-24 MED ORDER — EPINEPHRINE HCL 1 MG/ML IJ SOLN
INTRAMUSCULAR | Status: AC
  Filled 2011-09-24: qty 5

## 2011-09-24 MED ORDER — MIDAZOLAM HCL 2 MG/2ML IJ SOLN
1.0000 mg | INTRAMUSCULAR | Status: DC | PRN
  Administered 2011-09-24: 2 mg via INTRAVENOUS

## 2011-09-24 SURGICAL SUPPLY — 56 items
ARTHROWAND PARAGON T2 (SURGICAL WAND)
BAG HAMPER (MISCELLANEOUS) ×2 IMPLANT
BANDAGE ELASTIC 6 VELCRO NS (GAUZE/BANDAGES/DRESSINGS) ×2 IMPLANT
BLADE AGGRESSIVE PLUS 4.0 (BLADE) ×2 IMPLANT
BLADE SURG SZ11 CARB STEEL (BLADE) ×2 IMPLANT
CHLORAPREP W/TINT 26ML (MISCELLANEOUS) ×2 IMPLANT
CLOTH BEACON ORANGE TIMEOUT ST (SAFETY) ×2 IMPLANT
COOLER CRYO IC GRAV AND TUBE (ORTHOPEDIC SUPPLIES) ×2 IMPLANT
COVER PROBE W GEL 5X96 (DRAPES) IMPLANT
CUFF CRYO KNEE LG 20X31 COOLER (ORTHOPEDIC SUPPLIES) IMPLANT
CUFF CRYO KNEE18X23 MED (MISCELLANEOUS) ×2 IMPLANT
CUFF TOURNIQUET SINGLE 34IN LL (TOURNIQUET CUFF) ×2 IMPLANT
CUFF TOURNIQUET SINGLE 44IN (TOURNIQUET CUFF) IMPLANT
CUTTER ANGLED DBL BITE 4.5 (BURR) IMPLANT
DECANTER SPIKE VIAL GLASS SM (MISCELLANEOUS) ×4 IMPLANT
FLOOR PAD 36X40 (MISCELLANEOUS)
GAUZE SPONGE 4X4 16PLY XRAY LF (GAUZE/BANDAGES/DRESSINGS) ×2 IMPLANT
GAUZE XEROFORM 5X9 LF (GAUZE/BANDAGES/DRESSINGS) ×2 IMPLANT
GLOVE ECLIPSE 6.5 STRL STRAW (GLOVE) ×2 IMPLANT
GLOVE EXAM NITRILE MD LF STRL (GLOVE) ×2 IMPLANT
GLOVE INDICATOR 7.0 STRL GRN (GLOVE) ×2 IMPLANT
GLOVE SKINSENSE NS SZ8.0 LF (GLOVE) ×1
GLOVE SKINSENSE STRL SZ8.0 LF (GLOVE) ×1 IMPLANT
GLOVE SS N UNI LF 8.5 STRL (GLOVE) ×2 IMPLANT
GOWN BRE IMP SLV AUR XL STRL (GOWN DISPOSABLE) ×2 IMPLANT
GOWN STRL REIN XL XLG (GOWN DISPOSABLE) ×2 IMPLANT
HLDR LEG FOAM (MISCELLANEOUS) ×1 IMPLANT
IV NS IRRIG 3000ML ARTHROMATIC (IV SOLUTION) ×4 IMPLANT
KIT BLADEGUARD II DBL (SET/KITS/TRAYS/PACK) ×2 IMPLANT
KIT ROOM TURNOVER AP CYSTO (KITS) ×2 IMPLANT
LEG HOLDER FOAM (MISCELLANEOUS) ×1
MANIFOLD NEPTUNE II (INSTRUMENTS) ×2 IMPLANT
MARKER SKIN DUAL TIP RULER LAB (MISCELLANEOUS) ×2 IMPLANT
NEEDLE HYPO 18GX1.5 BLUNT FILL (NEEDLE) ×2 IMPLANT
NEEDLE HYPO 21X1.5 SAFETY (NEEDLE) ×2 IMPLANT
NEEDLE SPNL 18GX3.5 QUINCKE PK (NEEDLE) ×2 IMPLANT
NS IRRIG 1000ML POUR BTL (IV SOLUTION) ×2 IMPLANT
PACK ARTHRO LIMB DRAPE STRL (MISCELLANEOUS) IMPLANT
PAD ABD 5X9 TENDERSORB (GAUZE/BANDAGES/DRESSINGS) ×2 IMPLANT
PAD ARMBOARD 7.5X6 YLW CONV (MISCELLANEOUS) ×2 IMPLANT
PAD CAST 4YDX4 CTTN HI CHSV (CAST SUPPLIES) ×1 IMPLANT
PAD FLOOR 36X40 (MISCELLANEOUS) IMPLANT
PADDING CAST COTTON 4X4 STRL (CAST SUPPLIES) ×1
PADDING CAST COTTON 6X4 STRL (CAST SUPPLIES) ×2 IMPLANT
SET ARTHROSCOPY INST (INSTRUMENTS) ×2 IMPLANT
SET ARTHROSCOPY PUMP TUBE (IRRIGATION / IRRIGATOR) ×2 IMPLANT
SET BASIN LINEN APH (SET/KITS/TRAYS/PACK) ×2 IMPLANT
SPONGE GAUZE 4X4 12PLY (GAUZE/BANDAGES/DRESSINGS) ×2 IMPLANT
STRIP CLOSURE SKIN 1/2X4 (GAUZE/BANDAGES/DRESSINGS) ×2 IMPLANT
SUT ETHILON 3 0 FSL (SUTURE) IMPLANT
SYR 30ML LL (SYRINGE) ×2 IMPLANT
SYRINGE 10CC LL (SYRINGE) ×2 IMPLANT
WAND 50 DEG COVAC W/CORD (SURGICAL WAND) IMPLANT
WAND 90 DEG TURBOVAC W/CORD (SURGICAL WAND) ×2 IMPLANT
WAND ARTHRO PARAGON T2 (SURGICAL WAND) IMPLANT
YANKAUER SUCT BULB TIP 10FT TU (MISCELLANEOUS) ×8 IMPLANT

## 2011-09-24 NOTE — Anesthesia Preprocedure Evaluation (Signed)
Anesthesia Evaluation  Name, MR# and DOB Patient awake  General Assessment Comment  Reviewed: Allergy & Precautions, H&P , NPO status , Patient's Chart, lab work & pertinent test results  History of Anesthesia Complications Negative for: history of anesthetic complications  Airway Mallampati: II TM Distance: >3 FB Neck ROM: Full    Dental No notable dental hx.    Pulmonary asthma    Pulmonary exam normal       Cardiovascular Regular Normal    Neuro/Psych Negative Neurological ROS  Negative Psych ROS   GI/Hepatic negative GI ROS Neg liver ROS    Endo/Other  Negative Endocrine ROS  Renal/GU negative Renal ROS     Musculoskeletal negative musculoskeletal ROS (+)   Abdominal (+) obese,   Peds  Hematology negative hematology ROS (+)   Anesthesia Other Findings   Reproductive/Obstetrics negative OB ROS                           Anesthesia Physical Anesthesia Plan  ASA: II  Anesthesia Plan: General   Post-op Pain Management:    Induction: Intravenous  Airway Management Planned: LMA  Additional Equipment:   Intra-op Plan:   Post-operative Plan: Extubation in OR  Informed Consent: I have reviewed the patients History and Physical, chart, labs and discussed the procedure including the risks, benefits and alternatives for the proposed anesthesia with the patient or authorized representative who has indicated his/her understanding and acceptance.     Plan Discussed with: CRNA  Anesthesia Plan Comments:         Anesthesia Quick Evaluation

## 2011-09-24 NOTE — Brief Op Note (Signed)
09/24/2011  9:56 AM  PATIENT:  Bailey Mcdonald  40 y.o. female  PRE-OPERATIVE DIAGNOSIS:  patella femoral arthritis left knee  POST-OPERATIVE DIAGNOSIS:  patella femoral arthritis left knee  PROCEDURE:  Procedure(s): ARTHROSCOPY LEFT KNEE PATELLA CHONDROPLASTY  FINDINGS: MEDIAL AND LATERAL PATELLA FACET CHODROMALACIA GRADE 2   SURGEON:  Surgeon(s): [name], MD  PHYSICIAN ASSISTANT:   ASSISTANTS: none   ANESTHESIA:   general  EBL: NONE  Total I/O In: 550 [I.V.:550] Out: 0   BLOOD ADMINISTERED:none  DRAINS: NONE   LOCAL MEDICATIONS USED:  MARCAINE W EPI 60 CC  SPECIMEN:  No Specimen  DISPOSITION OF SPECIMEN:  N/A  COUNTS:  YES  TOURNIQUET:  NONE  DICTATION: .Dragon Dictation  PLAN OF CARE: Discharge to home after PACU  PATIENT DISPOSITION:  PACU - hemodynamically stable.   Delay start of Pharmacological VTE agent (>24hrs) due to surgical blood loss or risk of bleeding:  not applicable

## 2011-09-24 NOTE — Anesthesia Postprocedure Evaluation (Signed)
  Anesthesia Post-op Note  Patient: Bailey Mcdonald  Procedure(s) Performed:  ARTHROSCOPY KNEE; CHONDROPLASTY - Chondroplasty Patella  Patient Location: PACU  Anesthesia Type: General  Level of Consciousness: awake, alert  and oriented  Airway and Oxygen Therapy: Patient Spontanous Breathing and Patient connected to face mask oxygen  Post-op Pain: none  Post-op Assessment: Post-op Vital signs reviewed, Patient's Cardiovascular Status Stable, Respiratory Function Stable and Patent Airway  Post-op Vital Signs: Reviewed and stable  Complications: No apparent anesthesia complications

## 2011-09-24 NOTE — Transfer of Care (Signed)
Immediate Anesthesia Transfer of Care Note  Patient: Bailey Mcdonald  Procedure(s) Performed:  ARTHROSCOPY KNEE; CHONDROPLASTY - Chondroplasty Patella  Patient Location: PACU  Anesthesia Type: General  Level of Consciousness: awake, alert  and oriented  Airway & Oxygen Therapy: Patient Spontanous Breathing and Patient connected to face mask oxygen  Post-op Assessment: Report given to PACU RN  Post vital signs: Reviewed and stable  Complications: No apparent anesthesia complications

## 2011-09-24 NOTE — Interval H&P Note (Signed)
History and Physical Interval Note:   09/24/2011   8:54 AM   Bailey Mcdonald  has presented today for surgery, with the diagnosis of patella femoral arthritis left knee  The various methods of treatment have been discussed with the patient and family. After consideration of risks, benefits and other options for treatment, the patient has consented to  Procedure(s): ARTHROSCOPY LEFT KNEE PATELLA CHONDROPLASTY as a surgical intervention .  I have reviewed the patients' chart and labs.  Questions were answered to the patient's satisfaction.    H&P guidelines Update  The H/P was reviewed, the patient was examined, and there is no change in the patient's condition since the original H/P was completed.  Per Joint commission requirements   Terrance Mass., MD  [name]  MD

## 2011-09-24 NOTE — Op Note (Addendum)
Procedure note   09/24/2011  Procedure: Arthroscopy left knee patella chondroplasty Indication: pain   Preop diagnosis patellofemoral arthritis left knee Postop diagnosis patellofemoral arthritis left knee Procedure arthroscopy left knee patella chondroplasty Surgeon Romeo Apple Assistants none Anesthesia Gen. Operative findings: Grade 2 chondromalacia medial and lateral patella facets The patient was identified in the preop area and the chart was updated and the surgical site was signed.  The patient was taken to the operating room for general anesthesia. She received 1 g of Ancef IV. The left leg was prepped and draped sterilely and placed in an arthroscopic leg holder.  After completion of the timeout procedure, the portal sites were injected with dilute Marcaine and epinephrine solution and the joint was injected with 10 cc of the same.  A lateral portal was established the scope was introduced into the medial compartment. A diagnostic arthroscopy was performed. The medial compartment was normal, the patellofemoral joint was noted to have chondromalacia. The lateral compartment was normal and the notch was normal.  The chondromalacia was diffuse in both facets with grade 2 lesions.  Through a medial portal a motorized shaver was used to debride the patella. A 90 wand set on the first setting was then used to smooth the articular cartilage. The knee was then taken through range of motion to observe patellofemoral tracking which was normal.  The knee was then irrigated suctioned and closed with 3-0 nylon suture.  A total of 60 cc of Marcaine with epinephrine was injected into the joint. Sterile dressing was applied a Cryo/Cuff was applied and activated. The patient was extubated and taken to the recovery room in stable condition.  She was discharged on hydrocodone 7.5 mg every 4 hours when necessary for pain #42 with 2 refills  She has a followup scheduled for Monday.

## 2011-09-24 NOTE — Anesthesia Procedure Notes (Addendum)
Procedure Name: LMA Insertion Date/Time: 09/24/2011 9:14 AM Performed by: Glynn Octave Pre-anesthesia Checklist: Patient identified, Patient being monitored, Emergency Drugs available, Timeout performed and Suction available Patient Re-evaluated:Patient Re-evaluated prior to inductionOxygen Delivery Method: Circle System Utilized Preoxygenation: Pre-oxygenation with 100% oxygen Intubation Type: IV induction Ventilation: Mask ventilation without difficulty LMA: LMA inserted LMA Size: 4.0 Number of attempts: 1 Placement Confirmation: positive ETCO2 and breath sounds checked- equal and bilateral

## 2011-09-27 ENCOUNTER — Encounter: Payer: Self-pay | Admitting: Orthopedic Surgery

## 2011-09-27 ENCOUNTER — Ambulatory Visit (INDEPENDENT_AMBULATORY_CARE_PROVIDER_SITE_OTHER): Payer: Medicaid Other | Admitting: Orthopedic Surgery

## 2011-09-27 VITALS — Ht 67.0 in | Wt 220.0 lb

## 2011-09-27 DIAGNOSIS — M2240 Chondromalacia patellae, unspecified knee: Secondary | ICD-10-CM

## 2011-09-27 MED ORDER — IBUPROFEN 800 MG PO TABS: 800.0000 mg | ORAL_TABLET | Freq: Three times a day (TID) | ORAL | Status: AC | PRN

## 2011-09-27 NOTE — Progress Notes (Signed)
Postoperative visit #1 diagnosis patellofemoral arthritis, LEFT knee.  Procedure arthroscopy and chondroplasty, LEFT patella.  Date of surgery October 12  Patient complains of throbbing, LEFT knee.  Portal sites are clean. Sutures are removed.  Patient will start physical therapy and return in 3 weeks

## 2011-09-27 NOTE — Patient Instructions (Signed)
Start PT 

## 2011-09-29 LAB — COMPREHENSIVE METABOLIC PANEL
AST: 18
CO2: 18 — ABNORMAL LOW
Calcium: 9.2
Creatinine, Ser: 0.97
GFR calc Af Amer: >60
GFR calc non Af Amer: >60

## 2011-09-29 LAB — DIFFERENTIAL
Eosinophils Relative: 2
Lymphocytes Relative: 23
Metamyelocytes Relative: 3
Monocytes Relative: 4
nRBC: 1 — ABNORMAL HIGH

## 2011-09-29 LAB — LIPASE, BLOOD: Lipase: 20

## 2011-09-29 LAB — URINALYSIS, ROUTINE W REFLEX MICROSCOPIC
Bilirubin Urine: NEGATIVE
Ketones, ur: NEGATIVE
Nitrite: POSITIVE — AB
Protein, ur: NEGATIVE
Specific Gravity, Urine: >1.03 — ABNORMAL HIGH
Urobilinogen, UA: 0.2

## 2011-09-29 LAB — CBC
MCHC: 34.6
MCV: 81.7
Platelets: 260
RBC: 4.93

## 2011-09-29 LAB — HCG, QUANTITATIVE, PREGNANCY: hCG, Beta Chain, Quant, S: <2

## 2011-09-29 LAB — PREGNANCY, URINE: Preg Test, Ur: NEGATIVE

## 2011-10-01 ENCOUNTER — Encounter (HOSPITAL_COMMUNITY): Payer: Self-pay | Admitting: Orthopedic Surgery

## 2011-10-19 ENCOUNTER — Ambulatory Visit (INDEPENDENT_AMBULATORY_CARE_PROVIDER_SITE_OTHER): Payer: Medicaid Other | Admitting: Orthopedic Surgery

## 2011-10-19 ENCOUNTER — Encounter: Payer: Self-pay | Admitting: Orthopedic Surgery

## 2011-10-19 VITALS — BP 110/84 | Ht 67.0 in

## 2011-10-19 DIAGNOSIS — Z9889 Other specified postprocedural states: Secondary | ICD-10-CM

## 2011-10-19 NOTE — Patient Instructions (Signed)
Terminal knee extension instead of full knee extension

## 2011-10-20 ENCOUNTER — Encounter: Payer: Self-pay | Admitting: Orthopedic Surgery

## 2011-10-20 DIAGNOSIS — Z9889 Other specified postprocedural states: Secondary | ICD-10-CM | POA: Insufficient documentation

## 2011-10-20 NOTE — Progress Notes (Signed)
Status post LEFT knee arthroscopy with chondroplasty of the patella  Doing well.  Exercising at home.  She does feel a catching sensation at 45 of knee flexion  Otherwise the knee looks good feels good  Continue exercises, avoid full knee flexion extension exercises and Continue with quadriceps sets and terminal knee extensions

## 2011-10-26 ENCOUNTER — Other Ambulatory Visit: Payer: Self-pay | Admitting: Orthopedic Surgery

## 2011-10-27 ENCOUNTER — Other Ambulatory Visit: Payer: Self-pay | Admitting: *Deleted

## 2011-10-27 MED ORDER — HYDROCODONE-ACETAMINOPHEN 5-500 MG PO TABS: 1.0000 | ORAL_TABLET | Freq: Four times a day (QID) | ORAL | Status: DC | PRN

## 2011-10-27 NOTE — Telephone Encounter (Signed)
Change to 5/500   40 tabs 3 refills   Call in

## 2011-11-22 ENCOUNTER — Telehealth: Payer: Self-pay | Admitting: Orthopedic Surgery

## 2011-11-22 NOTE — Telephone Encounter (Signed)
Patient called and re-scheduled her post op appointment for tomorrow, 11/23/11. States unable to come in. Offered appointment for this Thursday, 11/25/11, said unable to come then due to another appointment. Re-scheduled for 12/16/11.

## 2011-11-23 ENCOUNTER — Ambulatory Visit: Payer: Medicaid Other | Admitting: Orthopedic Surgery

## 2011-11-28 ENCOUNTER — Other Ambulatory Visit: Payer: Self-pay | Admitting: Orthopedic Surgery

## 2011-11-28 NOTE — Telephone Encounter (Signed)
Declined

## 2011-11-30 ENCOUNTER — Other Ambulatory Visit: Payer: Self-pay | Admitting: Orthopedic Surgery

## 2011-12-01 ENCOUNTER — Other Ambulatory Visit: Payer: Self-pay | Admitting: *Deleted

## 2011-12-01 MED ORDER — HYDROCODONE-ACETAMINOPHEN 5-500 MG PO TABS: 1.0000 | ORAL_TABLET | Freq: Four times a day (QID) | ORAL | Status: DC | PRN

## 2011-12-01 NOTE — Telephone Encounter (Signed)
Refill

## 2011-12-16 ENCOUNTER — Ambulatory Visit: Payer: Medicaid Other | Admitting: Orthopedic Surgery

## 2011-12-23 ENCOUNTER — Ambulatory Visit (INDEPENDENT_AMBULATORY_CARE_PROVIDER_SITE_OTHER): Payer: Medicaid Other | Admitting: Orthopedic Surgery

## 2011-12-23 ENCOUNTER — Encounter: Payer: Self-pay | Admitting: Orthopedic Surgery

## 2011-12-23 VITALS — BP 98/62 | Ht 67.0 in | Wt 236.0 lb

## 2011-12-23 DIAGNOSIS — M2240 Chondromalacia patellae, unspecified knee: Secondary | ICD-10-CM

## 2011-12-23 DIAGNOSIS — M2242 Chondromalacia patellae, left knee: Secondary | ICD-10-CM | POA: Insufficient documentation

## 2011-12-23 MED ORDER — HYDROCODONE-ACETAMINOPHEN 5-500 MG PO TABS: 1.0000 | ORAL_TABLET | Freq: Four times a day (QID) | ORAL | Status: DC | PRN

## 2011-12-23 NOTE — Patient Instructions (Signed)
Take medicine as needed.

## 2011-12-23 NOTE — Progress Notes (Signed)
Patient ID: Bailey Mcdonald, female   DOB: 1971-06-16, 41 y.o.   MRN: [id number] Chief Complaint  Patient presents with  . Follow-up    1 month recheck on left knee. DOS 09-24-11.   Status post bilateral knee arthroscopies RIGHT than LEFT for patellofemoral chondromalacia severe  The pain is improved in both knees however the LEFT knee continues to catch at 30 flexion especially when the patella engaged the trochlea  This is not unexpected no unfortunate.  She takes hydrocodone as needed  Minimal swelling  This is demonstrated on physical exam from the extended position at 30 flexion the knee catches.  Recommend continue modified activities to take stress off the knee.  Follow-up 6 months

## 2012-04-07 ENCOUNTER — Other Ambulatory Visit: Payer: Self-pay | Admitting: Orthopedic Surgery

## 2012-04-10 NOTE — Telephone Encounter (Signed)
Refill

## 2012-04-11 ENCOUNTER — Other Ambulatory Visit: Payer: Self-pay | Admitting: *Deleted

## 2012-04-11 DIAGNOSIS — M2242 Chondromalacia patellae, left knee: Secondary | ICD-10-CM

## 2012-04-11 MED ORDER — HYDROCODONE-ACETAMINOPHEN 5-500 MG PO TABS: 1.0000 | ORAL_TABLET | Freq: Four times a day (QID) | ORAL | Status: DC | PRN

## 2012-06-20 ENCOUNTER — Telehealth: Payer: Self-pay | Admitting: Orthopedic Surgery

## 2012-06-20 NOTE — Telephone Encounter (Signed)
Please call Marchelle Folks at [phone], she needs to speak with you about getting a Celebrex prescription  transferred

## 2012-06-21 ENCOUNTER — Ambulatory Visit: Payer: Medicaid Other | Admitting: Orthopedic Surgery

## 2012-06-21 MED ORDER — CELECOXIB 200 MG PO CAPS: 200.0000 mg | ORAL_CAPSULE | Freq: Every day | ORAL | Status: DC

## 2012-06-21 NOTE — Telephone Encounter (Signed)
Med sent as requested 

## 2012-06-22 ENCOUNTER — Ambulatory Visit: Payer: Medicaid Other | Admitting: Orthopedic Surgery

## 2012-06-28 ENCOUNTER — Ambulatory Visit (INDEPENDENT_AMBULATORY_CARE_PROVIDER_SITE_OTHER): Payer: Medicaid Other | Admitting: Orthopedic Surgery

## 2012-06-28 ENCOUNTER — Encounter: Payer: Self-pay | Admitting: Orthopedic Surgery

## 2012-06-28 VITALS — BP 100/62 | Ht 67.0 in | Wt 206.0 lb

## 2012-06-28 DIAGNOSIS — Z9889 Other specified postprocedural states: Secondary | ICD-10-CM

## 2012-06-28 DIAGNOSIS — M2240 Chondromalacia patellae, unspecified knee: Secondary | ICD-10-CM

## 2012-06-28 DIAGNOSIS — M2241 Chondromalacia patellae, right knee: Secondary | ICD-10-CM

## 2012-06-28 DIAGNOSIS — M2242 Chondromalacia patellae, left knee: Secondary | ICD-10-CM

## 2012-06-28 NOTE — Patient Instructions (Addendum)
Celebrex samples until we hear from the insurer

## 2012-06-28 NOTE — Progress Notes (Signed)
Patient ID: Bailey Mcdonald, female   DOB: [birth year], 41 y.o.   MRN: [id number] BP 100/62  Ht 5\' 7"  (1.702 m)  Wt 206 lb (93.441 kg)  BMI 32.26 kg/m2  Chief Complaint  Patient presents with  . Knee Problem    s/p B/L knee scopes for chondromalacia     Very well status post bilateral knee arthroscopy she still has some grinding sensation but no pain unless she is doing a lot of stair activity  No catching locking or giving way  Both knees have free and easy range of motion which is full there is no effusion there is crepitance both knees are stable with good muscle tone normal skin good pulses normal temperature  Stable status post bilateral chondroplasties of the patella for severe patellofemoral disease  She's given Celebrex samples because her insurance is refusing to allow her to refill her medication. However she does not tolerate ibuprofen, she's also tried Naprosyn which both cause significant GI irritation. She tried Mobic with no relief of pain  Insurance approval still pending

## 2012-07-03 ENCOUNTER — Other Ambulatory Visit: Payer: Self-pay | Admitting: *Deleted

## 2012-07-03 MED ORDER — HYDROCODONE-ACETAMINOPHEN 7.5-325 MG PO TABS: 1.0000 | ORAL_TABLET | ORAL | Status: AC | PRN

## 2012-07-26 ENCOUNTER — Other Ambulatory Visit: Payer: Self-pay | Admitting: *Deleted

## 2012-07-26 MED ORDER — HYDROCODONE-ACETAMINOPHEN 5-325 MG PO TABS: 1.0000 | ORAL_TABLET | ORAL | Status: AC | PRN

## 2012-08-30 ENCOUNTER — Telehealth: Payer: Self-pay | Admitting: Family Medicine

## 2012-08-30 NOTE — Telephone Encounter (Signed)
She will need to call them and appeal

## 2012-08-30 NOTE — Telephone Encounter (Signed)
i have tried to prior auth this and it was denied, what else can we give her?

## 2012-09-05 NOTE — Telephone Encounter (Signed)
Patient aware.

## 2012-09-06 ENCOUNTER — Other Ambulatory Visit: Payer: Self-pay | Admitting: Orthopedic Surgery

## 2012-09-06 DIAGNOSIS — M9420 Chondromalacia, unspecified site: Secondary | ICD-10-CM

## 2012-09-07 ENCOUNTER — Other Ambulatory Visit: Payer: Self-pay | Admitting: Orthopedic Surgery

## 2012-09-07 DIAGNOSIS — M2242 Chondromalacia patellae, left knee: Secondary | ICD-10-CM

## 2012-09-07 MED ORDER — HYDROCODONE-ACETAMINOPHEN 5-500 MG PO TABS: 1.0000 | ORAL_TABLET | Freq: Four times a day (QID) | ORAL | Status: DC | PRN

## 2012-09-12 ENCOUNTER — Other Ambulatory Visit: Payer: Self-pay | Admitting: Orthopedic Surgery

## 2012-09-12 ENCOUNTER — Other Ambulatory Visit: Payer: Self-pay | Admitting: *Deleted

## 2012-10-06 ENCOUNTER — Other Ambulatory Visit (HOSPITAL_COMMUNITY): Payer: Self-pay | Admitting: Internal Medicine

## 2012-10-06 ENCOUNTER — Ambulatory Visit (HOSPITAL_COMMUNITY)
Admission: RE | Admit: 2012-10-06 | Discharge: 2012-10-06 | Disposition: A | Discharge status: Home / Self Care | Payer: Medicaid Other | Source: Ambulatory Visit | Attending: Internal Medicine | Admitting: Internal Medicine

## 2012-10-06 DIAGNOSIS — M546 Pain in thoracic spine: Secondary | ICD-10-CM | POA: Insufficient documentation

## 2012-10-06 DIAGNOSIS — M549 Dorsalgia, unspecified: Secondary | ICD-10-CM | POA: Insufficient documentation

## 2012-10-23 MED ORDER — IBUPROFEN 800 MG PO TABS: 800.0000 mg | ORAL_TABLET | Freq: Three times a day (TID) | ORAL | Status: DC | PRN

## 2013-01-02 ENCOUNTER — Ambulatory Visit: Payer: Medicaid Other | Admitting: Orthopedic Surgery

## 2013-01-10 ENCOUNTER — Ambulatory Visit: Payer: Medicaid Other | Admitting: Orthopedic Surgery

## 2013-01-17 ENCOUNTER — Encounter: Payer: Self-pay | Admitting: Orthopedic Surgery

## 2013-01-17 ENCOUNTER — Ambulatory Visit (INDEPENDENT_AMBULATORY_CARE_PROVIDER_SITE_OTHER): Payer: Medicaid Other | Admitting: Orthopedic Surgery

## 2013-01-17 VITALS — BP 104/70 | Ht 67.0 in | Wt 206.0 lb

## 2013-01-17 DIAGNOSIS — M9420 Chondromalacia, unspecified site: Secondary | ICD-10-CM

## 2013-01-17 MED ORDER — HYDROCODONE-ACETAMINOPHEN 5-325 MG PO TABS: 1.0000 | ORAL_TABLET | ORAL | Status: DC | PRN

## 2013-01-17 MED ORDER — IBUPROFEN 800 MG PO TABS: 800.0000 mg | ORAL_TABLET | Freq: Three times a day (TID) | ORAL | Status: DC | PRN

## 2013-01-17 NOTE — Patient Instructions (Addendum)
Continue advil and narco   Maintain quadriceps strength

## 2013-01-17 NOTE — Progress Notes (Signed)
Patient ID: Bailey Mcdonald, female   DOB: [birth year], 42 y.o.   MRN: [id number] Chief Complaint  Patient presents with  . Follow-up    follow up bilateral PTF disease    Status post bilateral knee arthroscopies with chondroplasties were severe chondromalacia of the patella. 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  Her right knee responded much better than the left  Examination bilateral knee exam  No swelling tight to telemetry crepitance with range of motion and patellofemoral compression test which does cause pain ligaments are stable in terms of the collaterals and cruciates distal neurovascular function is intact. No joint effusion is noted no joint line tenderness noted  Impression bilateral patellofemoral disease with chondromalacia  Continue ibuprofen and Norco  I think based on the patient's age unless she is considering patellofemoral replacement she may need debridement every 2-3 years until she becomes a candidate for total knee replacement.

## 2013-05-28 ENCOUNTER — Encounter: Payer: Self-pay | Admitting: Orthopedic Surgery

## 2013-07-18 ENCOUNTER — Ambulatory Visit: Payer: Medicaid Other | Admitting: Orthopedic Surgery

## 2013-12-17 ENCOUNTER — Emergency Department (HOSPITAL_COMMUNITY)
Admission: EM | Admit: 2013-12-17 | Discharge: 2013-12-17 | Disposition: A | Discharge status: Home / Self Care | Payer: Medicaid Other | Attending: Emergency Medicine | Admitting: Emergency Medicine

## 2013-12-17 ENCOUNTER — Encounter (HOSPITAL_COMMUNITY): Payer: Self-pay | Admitting: Emergency Medicine

## 2013-12-17 DIAGNOSIS — Z8719 Personal history of other diseases of the digestive system: Secondary | ICD-10-CM | POA: Insufficient documentation

## 2013-12-17 DIAGNOSIS — J3489 Other specified disorders of nose and nasal sinuses: Secondary | ICD-10-CM | POA: Insufficient documentation

## 2013-12-17 DIAGNOSIS — M129 Arthropathy, unspecified: Secondary | ICD-10-CM | POA: Insufficient documentation

## 2013-12-17 DIAGNOSIS — R0789 Other chest pain: Secondary | ICD-10-CM | POA: Insufficient documentation

## 2013-12-17 DIAGNOSIS — J45901 Unspecified asthma with (acute) exacerbation: Secondary | ICD-10-CM | POA: Insufficient documentation

## 2013-12-17 DIAGNOSIS — Z862 Personal history of diseases of the blood and blood-forming organs and certain disorders involving the immune mechanism: Secondary | ICD-10-CM | POA: Insufficient documentation

## 2013-12-17 DIAGNOSIS — Z79899 Other long term (current) drug therapy: Secondary | ICD-10-CM | POA: Insufficient documentation

## 2013-12-17 MED ORDER — PREDNISONE 10 MG PO TABS: ORAL_TABLET | ORAL | Status: DC

## 2013-12-17 MED ORDER — AZITHROMYCIN 250 MG PO TABS: ORAL_TABLET | ORAL | Status: DC

## 2013-12-17 MED ORDER — ALBUTEROL SULFATE (2.5 MG/3ML) 0.083% IN NEBU
5.0000 mg | INHALATION_SOLUTION | Freq: Once | RESPIRATORY_TRACT | Status: AC
  Administered 2013-12-17: 5 mg via RESPIRATORY_TRACT
  Filled 2013-12-17: qty 6

## 2013-12-17 MED ORDER — GUAIFENESIN-CODEINE 100-10 MG/5ML PO SYRP: 10.0000 mL | ORAL_SOLUTION | Freq: Three times a day (TID) | ORAL | Status: DC | PRN

## 2013-12-17 MED ORDER — ALBUTEROL SULFATE HFA 108 (90 BASE) MCG/ACT IN AERS
2.0000 | INHALATION_SPRAY | Freq: Once | RESPIRATORY_TRACT | Status: AC
  Administered 2013-12-17: 2 via RESPIRATORY_TRACT
  Filled 2013-12-17: qty 6.7

## 2013-12-17 MED ORDER — IPRATROPIUM BROMIDE 0.02 % IN SOLN
0.5000 mg | Freq: Once | RESPIRATORY_TRACT | Status: AC
  Administered 2013-12-17: 0.5 mg via RESPIRATORY_TRACT
  Filled 2013-12-17: qty 2.5

## 2013-12-17 MED ORDER — PREDNISONE 50 MG PO TABS
60.0000 mg | ORAL_TABLET | Freq: Once | ORAL | Status: AC
  Administered 2013-12-17: 60 mg via ORAL
  Filled 2013-12-17 (×2): qty 1

## 2013-12-17 NOTE — ED Notes (Signed)
Paged RT for treatment

## 2013-12-17 NOTE — Discharge Instructions (Signed)
Asthma, Adult  Asthma is a disease of the lungs and can make it hard to breathe. Asthma cannot be cured, but medicine can help control it. Asthma may be started (triggered) by:  · Pollen.  · Dust.  · Animal skin flakes (dander).  · Molds.  · Foods.  · Respiratory infections (colds, flu).  · Smoke.  · Exercise.  · Stress.  · Other things that cause allergic reactions or allergies (allergens).  HOME CARE   · Talk to your doctor about how to manage your attacks at home. This may include:  · Using a tool called a peak flow meter.  · Having medicine ready to stop the attack.  · Take all medicine as told by your doctor.  · Wash bed sheets and blankets every week in hot water and put them in the dryer.  · Drink enough fluids to keep your pee (urine) clear or pale yellow.  · Always be ready to get emergency help. Write down the phone number for your doctor. Keep it where you can easily find it.  · Talk about exercise routines with your doctor.  · If animal dander is causing your asthma, you may need to find a new home for your pet(s).  GET HELP RIGHT AWAY IF:   · You have muscle aches.  · You cough more.  · You have chest pain.  · You have thick spit (sputum) that changes to yellow, green, gray, or bloody.  · Medicine does not stop your wheezing.  · You have problems breathing.  · You have a fever.  · Your medicine causes:  · A rash.  · Itching.  · Puffiness (swelling).  · Breathing problems.  MAKE SURE YOU:   · Understand these instructions.  · Will watch your condition.  · Will get help right away if you are not doing well or get worse.  Document Released: 05/17/2008 Document Revised: 03/26/2013 Document Reviewed: 10/09/2008  [employer]® Patient Information ©2014 [employer].

## 2013-12-17 NOTE — ED Notes (Signed)
Asthma "tightness" sl cough,  Out of singulair.  Has been using inhaler, No fever.  Alert,

## 2013-12-19 NOTE — ED Provider Notes (Signed)
CSN: [id number]     Arrival date & time 12/17/13  1231 History   First MD Initiated Contact with Patient 12/17/13 1516     Chief Complaint  Patient presents with  . Asthma   (Consider location/radiation/quality/duration/timing/severity/associated sxs/prior Treatment) Patient is a 43 y.o. female presenting with asthma. The history is provided by the patient.  Asthma This is a recurrent problem. The current episode started in the past 7 days. The problem occurs constantly. The problem has been gradually worsening. Associated symptoms include congestion and coughing. Pertinent negatives include no abdominal pain, change in bowel habit, chest pain, chills, diaphoresis, fever, headaches, joint swelling, nausea, neck pain, numbness, rash, sore throat, swollen glands, visual change, vomiting or weakness. The symptoms are aggravated by coughing. Treatments tried: albuterol. The treatment provided mild relief.    Past Medical History  Diagnosis Date  . Asthma   . Arthritis   . IBS (irritable bowel syndrome)   . Hypoglycemia   . Anemia    Past Surgical History  Procedure Laterality Date  . Mouth surgery      removal of teeth  . Tubal ligation    . Knee surgery  05/2011    rtight knee scope chondroplasty patella  . Abdominal hysterectomy    . Knee arthroscopy  09/24/2011    Procedure: ARTHROSCOPY KNEE;  Surgeon: Fuller CanadaStanley Harrison, MD;  Location: [facility];  Service: Orthopedics;  Laterality: Left;  . Chondroplasty  09/24/2011    Procedure: CHONDROPLASTY;  Surgeon: Fuller CanadaStanley Harrison, MD;  Location: [facility];  Service: Orthopedics;  Laterality: Left;  Chondroplasty Patella   Family History  Problem Relation Age of Onset  . Heart disease    . Arthritis    . Cancer    . Asthma    . Diabetes    . Anesthesia problems Neg Hx   . Hypotension Neg Hx   . Malignant hyperthermia Neg Hx   . Pseudochol deficiency Neg Hx    History  Substance Use Topics  . Smoking status: Never Smoker   . Smokeless  tobacco: Not on file  . Alcohol Use: No   OB History   Grav Para Term Preterm Abortions TAB SAB Ect Mult Living                 Review of Systems  Constitutional: Negative for fever, chills, diaphoresis, activity change and appetite change.  HENT: Positive for congestion and rhinorrhea. Negative for facial swelling, sore throat and trouble swallowing.   Eyes: Negative for visual disturbance.  Respiratory: Positive for cough, chest tightness and wheezing. Negative for shortness of breath and stridor.   Cardiovascular: Negative for chest pain.  Gastrointestinal: Negative for nausea, vomiting, abdominal pain and change in bowel habit.  Musculoskeletal: Negative for joint swelling, neck pain and neck stiffness.  Skin: Negative.  Negative for rash.  Neurological: Negative for dizziness, syncope, speech difficulty, weakness, numbness and headaches.  Hematological: Negative for adenopathy.  Psychiatric/Behavioral: Negative for confusion.  All other systems reviewed and are negative.    Allergies  Review of patient's allergies indicates no known allergies.  Home Medications   Current Outpatient Rx  Name  Route  Sig  Dispense  Refill  . albuterol (PROAIR HFA) 108 (90 BASE) MCG/ACT inhaler   Inhalation   Inhale 2 puffs into the lungs every 6 (six) hours as needed for wheezing or shortness of breath.         Marland Kitchen. azithromycin (ZITHROMAX Z-PAK) 250 MG tablet      Take  two tablets on day one, then one tab qd days 2-5   6 tablet   0   . guaiFENesin-codeine (ROBITUSSIN AC) 100-10 MG/5ML syrup   Oral   Take 10 mLs by mouth 3 (three) times daily as needed for cough.   120 mL   0   . predniSONE (DELTASONE) 10 MG tablet      Take 6 tablets day one, 5 tablets day two, 4 tablets day three, 3 tablets day four, 2 tablets day five, then 1 tablet day six   21 tablet   0    BP 123/80  Pulse 111  Temp(Src) 98 F (36.7 C) (Oral)  Resp 24  Ht 5\' 7"  (1.702 m)  Wt 230 lb (104.327 kg)   BMI 36.01 kg/m2  SpO2 95% Physical Exam  Nursing note and vitals reviewed. Constitutional: She is oriented to person, place, and time. She appears well-developed and well-nourished. No distress.  HENT:  Head: Normocephalic and atraumatic.  Right Ear: Tympanic membrane and ear canal normal.  Left Ear: Tympanic membrane and ear canal normal.  Mouth/Throat: Uvula is midline, oropharynx is clear and moist and mucous membranes are normal. No oropharyngeal exudate.  Eyes: EOM are normal. Pupils are equal, round, and reactive to light.  Neck: Normal range of motion, full passive range of motion without pain and phonation normal. Neck supple.  Cardiovascular: Normal rate, regular rhythm, normal heart sounds and intact distal pulses.   No murmur heard. Pulmonary/Chest: Effort normal. No stridor. No respiratory distress. She has wheezes. She has no rales. She exhibits no tenderness.  inspiratory and expiratory wheezes through out.  No rales or stridor.  No accessory muscle use  Musculoskeletal: She exhibits no edema.  Lymphadenopathy:    She has no cervical adenopathy.  Neurological: She is alert and oriented to person, place, and time. She exhibits normal muscle tone. Coordination normal.  Skin: Skin is warm and dry.    ED Course  Procedures (including critical care time) Labs Review Labs Reviewed - No data to display Imaging Review No results found.   EKG Interpretation   None       MDM   1. Asthma exacerbation     Pt with hx of asthma.  VSS.  No rales , fever, chills, or tachypnea, dyspnea or hypoxia.  Lungs sounds improved after nebs x 2 and prednisone.  Pt ambulates w/o desat, feeling better, requesting discharge.  She agrees to albuterol MDI, prednisone taper , robitussin AC for cough and close f/u with her PMD or to return here if sx's worsen.     Arleigh Dicola L. Sundi Slevin, PA-C 12/19/13 2026  Khalise Billard L. Eileene Kisling, PA-C 12/21/13 1400

## 2013-12-24 NOTE — ED Provider Notes (Signed)
Medical screening examination/treatment/procedure(s) were performed by non-physician practitioner and as supervising physician I was immediately available for consultation/collaboration.  EKG Interpretation   None         Shelda JakesScott W. Hetal Proano, MD 12/24/13 [id number]

## 2014-02-09 ENCOUNTER — Emergency Department (HOSPITAL_COMMUNITY)
Admission: EM | Admit: 2014-02-09 | Discharge: 2014-02-09 | Disposition: A | Discharge status: Home / Self Care | Payer: Self-pay | Attending: Emergency Medicine | Admitting: Emergency Medicine

## 2014-02-09 ENCOUNTER — Emergency Department (HOSPITAL_COMMUNITY): Payer: Self-pay

## 2014-02-09 ENCOUNTER — Encounter (HOSPITAL_COMMUNITY): Payer: Self-pay | Admitting: Emergency Medicine

## 2014-02-09 DIAGNOSIS — Z8719 Personal history of other diseases of the digestive system: Secondary | ICD-10-CM | POA: Insufficient documentation

## 2014-02-09 DIAGNOSIS — J45901 Unspecified asthma with (acute) exacerbation: Secondary | ICD-10-CM | POA: Insufficient documentation

## 2014-02-09 DIAGNOSIS — Z8639 Personal history of other endocrine, nutritional and metabolic disease: Secondary | ICD-10-CM | POA: Insufficient documentation

## 2014-02-09 DIAGNOSIS — Z79899 Other long term (current) drug therapy: Secondary | ICD-10-CM | POA: Insufficient documentation

## 2014-02-09 DIAGNOSIS — J45909 Unspecified asthma, uncomplicated: Secondary | ICD-10-CM

## 2014-02-09 DIAGNOSIS — Z862 Personal history of diseases of the blood and blood-forming organs and certain disorders involving the immune mechanism: Secondary | ICD-10-CM | POA: Insufficient documentation

## 2014-02-09 DIAGNOSIS — Z8739 Personal history of other diseases of the musculoskeletal system and connective tissue: Secondary | ICD-10-CM | POA: Insufficient documentation

## 2014-02-09 MED ORDER — HYDROCODONE-ACETAMINOPHEN 7.5-325 MG/15ML PO SOLN: 15.0000 mL | Freq: Four times a day (QID) | ORAL | Status: DC | PRN

## 2014-02-09 MED ORDER — ALBUTEROL SULFATE (2.5 MG/3ML) 0.083% IN NEBU
5.0000 mg | INHALATION_SOLUTION | Freq: Once | RESPIRATORY_TRACT | Status: AC
  Administered 2014-02-09: 5 mg via RESPIRATORY_TRACT
  Filled 2014-02-09: qty 6

## 2014-02-09 MED ORDER — PREDNISONE 20 MG PO TABS: 60.0000 mg | ORAL_TABLET | Freq: Once | ORAL | Status: DC

## 2014-02-09 MED ORDER — PREDNISONE 50 MG PO TABS
60.0000 mg | ORAL_TABLET | Freq: Once | ORAL | Status: AC
  Administered 2014-02-09: 22:00:00 60 mg via ORAL
  Filled 2014-02-09 (×2): qty 1

## 2014-02-09 MED ORDER — IPRATROPIUM BROMIDE 0.02 % IN SOLN
0.5000 mg | Freq: Once | RESPIRATORY_TRACT | Status: AC
  Administered 2014-02-09: 0.5 mg via RESPIRATORY_TRACT
  Filled 2014-02-09: qty 2.5

## 2014-02-09 MED ORDER — ALBUTEROL SULFATE HFA 108 (90 BASE) MCG/ACT IN AERS
2.0000 | INHALATION_SPRAY | RESPIRATORY_TRACT | Status: DC | PRN
  Administered 2014-02-09: 2 via RESPIRATORY_TRACT
  Filled 2014-02-09 (×2): qty 6.7

## 2014-02-09 NOTE — ED Notes (Signed)
Respiratory notified of pt's neb treatment order

## 2014-02-09 NOTE — Discharge Instructions (Signed)
Asthma, Acute Bronchospasm °Acute bronchospasm caused by asthma is also referred to as an asthma attack. Bronchospasm means your air passages become narrowed. The narrowing is caused by inflammation and tightening of the muscles in the air tubes (bronchi) in your lungs. This can make it hard to breath or cause you to wheeze and cough. °CAUSES °Possible triggers are: °· Animal dander from the skin, hair, or feathers of animals. °· Dust mites contained in house dust. °· Cockroaches. °· Pollen from trees or grass. °· Mold. °· Cigarette or tobacco smoke. °· Air pollutants such as dust, household cleaners, hair sprays, aerosol sprays, paint fumes, strong chemicals, or strong odors. °· Cold air or weather changes. Cold air may trigger inflammation. Winds increase molds and pollens in the air. °· Strong emotions such as crying or laughing hard. °· Stress. °· Certain medicines such as aspirin or beta-blockers. °· Sulfites in foods and drinks, such as dried fruits and wine. °· Infections or inflammatory conditions, such as a flu, cold, or inflammation of the nasal membranes (rhinitis). °· Gastroesophageal reflux disease (GERD). GERD is a condition where stomach acid backs up into your throat (esophagus). °· Exercise or strenuous activity. °SIGNS AND SYMPTOMS  °· Wheezing. °· Excessive coughing, particularly at night. °· Chest tightness. °· Shortness of breath. °DIAGNOSIS  °Your health care provider will ask you about your medical history and perform a physical exam. A chest X-ray or blood testing may be performed to look for other causes of your symptoms or other conditions that may have triggered your asthma attack.  °TREATMENT  °Treatment is aimed at reducing inflammation and opening up the airways in your lungs.  Most asthma attacks are treated with inhaled medicines. These include quick relief or rescue medicines (such as bronchodilators) and controller medicines (such as inhaled corticosteroids). These medicines are  sometimes given through an inhaler or a nebulizer. Systemic steroid medicine taken by mouth or given through an IV tube also can be used to reduce the inflammation when an attack is moderate or severe. Antibiotic medicines are only used if a bacterial infection is present.  °HOME CARE INSTRUCTIONS  °· Rest. °· Drink plenty of liquids. This helps the mucus to remain thin and be easily coughed up. Only use caffeine in moderation and do not use alcohol until you have recovered from your illness. °· Do not smoke. Avoid being exposed to secondhand smoke. °· You play a critical role in keeping yourself in good health. Avoid exposure to things that cause you to wheeze or to have breathing problems. °· Keep your medicines up to date and available. Carefully follow your health care provider's treatment plan. °· Take your medicine exactly as prescribed. °· When pollen or pollution is bad, keep windows closed and use an air conditioner or go to places with air conditioning. °· Asthma requires careful medical care. See your health care provider for a follow-up as advised. If you are more than [redacted] weeks pregnant and you were prescribed any new medicines, let your obstetrician know about the visit and how you are doing. Follow-up with your health care provider as directed. °· After you have recovered from your asthma attack, make an appointment with your outpatient doctor to talk about ways to reduce the likelihood of future attacks. If you do not have a doctor who manages your asthma, make an appointment with a primary care doctor to discuss your asthma. °SEEK IMMEDIATE MEDICAL CARE IF:  °· You are getting worse. °· You have trouble breathing. If severe, call   your local emergency services (911 in the [place] °· You develop chest pain or discomfort. °· You are vomiting. °· You are not able to keep fluids down. °· You are coughing up yellow, green, brown, or bloody sputum. °· You have a fever and your symptoms suddenly get  worse. °· You have trouble swallowing. °MAKE SURE YOU:  °· Understand these instructions. °· Will watch your condition. °· Will get help right away if you are not doing well or get worse. °Document Released: 03/16/2007 Document Revised: 08/01/2013 Document Reviewed: 06/06/2013 °[employer]® Patient Information ©2014 [employer]. °Bronchitis °Bronchitis is inflammation of the airways that extend from the windpipe into the lungs (bronchi). The inflammation often causes mucus to develop, which leads to a cough. If the inflammation becomes severe, it may cause shortness of breath. °CAUSES  °Bronchitis may be caused by:  °· Viral infections.   °· Bacteria.   °· Cigarette smoke.   °· Allergens, pollutants, and other irritants.   °SIGNS AND SYMPTOMS  °The most common symptom of bronchitis is a frequent cough that produces mucus. Other symptoms include: °· Fever.   °· Body aches.   °· Chest congestion.   °· Chills.   °· Shortness of breath.   °· Sore throat.   °DIAGNOSIS  °Bronchitis is usually diagnosed through a medical history and physical exam. Tests, such as chest X-rays, are sometimes done to rule out other conditions.  °TREATMENT  °You may need to avoid contact with whatever caused the problem (smoking, for example). Medicines are sometimes needed. These may include: °· Antibiotics. These may be prescribed if the condition is caused by bacteria. °· Cough suppressants. These may be prescribed for relief of cough symptoms.   °· Inhaled medicines. These may be prescribed to help open your airways and make it easier for you to breathe.   °· Steroid medicines. These may be prescribed for those with recurrent (chronic) bronchitis. °HOME CARE INSTRUCTIONS °· Get plenty of rest.   °· Drink enough fluids to keep your urine clear or pale yellow (unless you have a medical condition that requires fluid restriction). Increasing fluids may help thin your secretions and will prevent dehydration.   °· Only take over-the-counter or  prescription medicines as directed by your health care provider. °· Only take antibiotics as directed. Make sure you finish them even if you start to feel better. °· Avoid secondhand smoke, irritating chemicals, and strong fumes. These will make bronchitis worse. If you are a smoker, quit smoking. Consider using nicotine gum or skin patches to help control withdrawal symptoms. Quitting smoking will help your lungs heal faster.   °· Put a cool-mist humidifier in your bedroom at night to moisten the air. This may help loosen mucus. Change the water in the humidifier daily. You can also run the hot water in your shower and sit in the bathroom with the door closed for 5 10 minutes.   °· Follow up with your health care provider as directed.   °· Wash your hands frequently to avoid catching bronchitis again or spreading an infection to others.   °SEEK MEDICAL CARE IF: °Your symptoms do not improve after 1 week of treatment.  °SEEK IMMEDIATE MEDICAL CARE IF: °· Your fever increases. °· You have chills.   °· You have chest pain.   °· You have worsening shortness of breath.   °· You have bloody sputum. °· You faint.   °· You have lightheadedness. °· You have a severe headache.   °· You vomit repeatedly. °MAKE SURE YOU:  °· Understand these instructions. °· Will watch your condition. °· Will get help right away if you   are not doing well or get worse. °Document Released: 11/29/2005 Document Revised: 09/19/2013 Document Reviewed: 07/24/2013 °ExitCare® Patient Information ©2014 [employer]. ° °

## 2014-02-09 NOTE — ED Notes (Signed)
Hx of asthma and has been developing cold symptoms for 3 weeks.  Ran out of inhaler last week and has been trying to manage w/out it since.  Symptoms have worsened to point she can't carry out ADLs w/out difficulty breathing.

## 2014-02-09 NOTE — ED Provider Notes (Signed)
Medical screening examination/treatment/procedure(s) were performed by non-physician practitioner and as supervising physician I was immediately available for consultation/collaboration.   EKG Interpretation None        Benny LennertJoseph L Oriana Horiuchi, MD 02/09/14 2330

## 2014-02-09 NOTE — ED Provider Notes (Signed)
CSN: [id number]     Arrival date & time 02/09/14  2009 History   First MD Initiated Contact with Patient 02/09/14 2100     Chief Complaint  Patient presents with  . Shortness of Breath     (Consider location/radiation/quality/duration/timing/severity/associated sxs/prior Treatment) HPI Comments: Patient here today because she has been short of breath for a week. She is currently experiencing a dry cough. She used the last of her albuterol inhaler a week ago and has had no medication for her asthma since that time. She has tried Tylenol Cold and Sinus, and well as Mucinex DM with no relief. Asthma was previously controlled with Singulair and albuterol inhaler, but due to change in insurance she has not had Singulair prescription filled since February. She denies any fever or chest pain.   Patient is a 43 y.o. female presenting with shortness of breath. The history is provided by the patient.  Shortness of Breath Associated symptoms: cough and wheezing   Associated symptoms: no chest pain, no fever and no vomiting     Past Medical History  Diagnosis Date  . Asthma   . Arthritis   . IBS (irritable bowel syndrome)   . Hypoglycemia   . Anemia    Past Surgical History  Procedure Laterality Date  . Mouth surgery      removal of teeth  . Tubal ligation    . Knee surgery  05/2011    rtight knee scope chondroplasty patella  . Abdominal hysterectomy    . Knee arthroscopy  09/24/2011    Procedure: ARTHROSCOPY KNEE;  Surgeon: [name], MD;  Location: [facility];  Service: Orthopedics;  Laterality: Left;  . Chondroplasty  09/24/2011    Procedure: CHONDROPLASTY;  Surgeon: [name], MD;  Location: [facility];  Service: Orthopedics;  Laterality: Left;  Chondroplasty Patella   Family History  Problem Relation Age of Onset  . Heart disease    . Arthritis    . Cancer    . Asthma    . Diabetes    . Anesthesia problems Neg Hx   . Hypotension Neg Hx   . Malignant hyperthermia Neg Hx    . Pseudochol deficiency Neg Hx    History  Substance Use Topics  . Smoking status: Never Smoker   . Smokeless tobacco: Not on file  . Alcohol Use: No   OB History   Grav Para Term Preterm Abortions TAB SAB Ect Mult Living                 Review of Systems  Constitutional: Negative for fever.  Respiratory: Positive for cough, shortness of breath and wheezing.   Cardiovascular: Negative for chest pain.  Gastrointestinal: Negative for nausea and vomiting.      Allergies  Review of patient's allergies indicates no known allergies.  Home Medications   Current Outpatient Rx  Name  Route  Sig  Dispense  Refill  . albuterol (PROAIR HFA) 108 (90 BASE) MCG/ACT inhaler   Inhalation   Inhale 2 puffs into the lungs every 6 (six) hours as needed for wheezing or shortness of breath.          BP 122/92  Pulse 96  Temp(Src) 98.6 F (37 C) (Oral)  Resp 20  Ht 5\' 7"  (1.702 m)  Wt 215 lb (97.523 kg)  BMI 33.67 kg/m2  SpO2 97% Physical Exam  Constitutional: She is oriented to person, place, and time. She appears well-developed and well-nourished. No distress.  Neck: Normal range of  motion.  Cardiovascular: Normal rate and regular rhythm.   Pulmonary/Chest: No respiratory distress. She has wheezes.  Appears mildly short of breath  Abdominal: There is no tenderness.  Musculoskeletal: She exhibits no edema.  Neurological: She is alert and oriented to person, place, and time.  Skin: No rash noted. She is not diaphoretic.  Psychiatric: She has a normal mood and affect.    ED Course  Procedures (including critical care time) Labs Review Labs Reviewed - No data to display Imaging Review Dg Chest 2 View  02/09/2014   CLINICAL DATA:  Cough, congestion, shortness of breath, asthma  EXAM: CHEST  2 VIEW  COMPARISON:  None.  FINDINGS: Lungs are clear.  No pleural effusion or pneumothorax.  The heart is normal in size.  Mild degenerative changes of the visualized thoracolumbar spine.   IMPRESSION: No evidence of acute cardiopulmonary disease.   Electronically Signed   By: Charline BillsSriyesh  Krishnan M.D.   On: 02/09/2014 21:09     EKG Interpretation None      MDM   Final diagnoses:  None    1. Asthmatic bronchitis  Wheezing improved after nebulizer x 1 with persistent cough. She reports feeling "90%" better. Prednisone given in ED, will continue x 3 days. Encouraged to follow up with PCP. Return precautions given.    Arnoldo HookerShari A Ghali Morissette, PA-C 02/09/14 2305  Arnoldo HookerShari A Ovella Manygoats, PA-C 02/09/14 2307

## 2014-02-21 ENCOUNTER — Emergency Department (HOSPITAL_COMMUNITY)
Admission: EM | Admit: 2014-02-21 | Discharge: 2014-02-21 | Disposition: A | Discharge status: Home / Self Care | Payer: Self-pay | Attending: Emergency Medicine | Admitting: Emergency Medicine

## 2014-02-21 ENCOUNTER — Emergency Department (HOSPITAL_COMMUNITY): Payer: Self-pay

## 2014-02-21 ENCOUNTER — Encounter (HOSPITAL_COMMUNITY): Payer: Self-pay | Admitting: Emergency Medicine

## 2014-02-21 DIAGNOSIS — Z862 Personal history of diseases of the blood and blood-forming organs and certain disorders involving the immune mechanism: Secondary | ICD-10-CM | POA: Insufficient documentation

## 2014-02-21 DIAGNOSIS — Z9889 Other specified postprocedural states: Secondary | ICD-10-CM | POA: Insufficient documentation

## 2014-02-21 DIAGNOSIS — J45901 Unspecified asthma with (acute) exacerbation: Secondary | ICD-10-CM | POA: Insufficient documentation

## 2014-02-21 DIAGNOSIS — Z8719 Personal history of other diseases of the digestive system: Secondary | ICD-10-CM | POA: Insufficient documentation

## 2014-02-21 DIAGNOSIS — Z79899 Other long term (current) drug therapy: Secondary | ICD-10-CM | POA: Insufficient documentation

## 2014-02-21 DIAGNOSIS — Z8739 Personal history of other diseases of the musculoskeletal system and connective tissue: Secondary | ICD-10-CM | POA: Insufficient documentation

## 2014-02-21 MED ORDER — PREDNISONE 10 MG PO TABS
ORAL_TABLET | ORAL | Status: AC
  Filled 2014-02-21: qty 1

## 2014-02-21 MED ORDER — ALBUTEROL SULFATE (2.5 MG/3ML) 0.083% IN NEBU
2.5000 mg | INHALATION_SOLUTION | Freq: Once | RESPIRATORY_TRACT | Status: AC
  Administered 2014-02-21: 2.5 mg via RESPIRATORY_TRACT
  Filled 2014-02-21: qty 3

## 2014-02-21 MED ORDER — PREDNISONE 50 MG PO TABS
60.0000 mg | ORAL_TABLET | Freq: Once | ORAL | Status: AC
  Administered 2014-02-21: 20:00:00 60 mg via ORAL

## 2014-02-21 MED ORDER — PREDNISONE 50 MG PO TABS: ORAL_TABLET | ORAL | Status: DC

## 2014-02-21 MED ORDER — ALBUTEROL SULFATE HFA 108 (90 BASE) MCG/ACT IN AERS
2.0000 | INHALATION_SPRAY | RESPIRATORY_TRACT | Status: DC | PRN
  Administered 2014-02-21: 2 via RESPIRATORY_TRACT
  Filled 2014-02-21: qty 6.7

## 2014-02-21 MED ORDER — IPRATROPIUM-ALBUTEROL 0.5-2.5 (3) MG/3ML IN SOLN
3.0000 mL | Freq: Once | RESPIRATORY_TRACT | Status: AC
  Administered 2014-02-21: 3 mL via RESPIRATORY_TRACT
  Filled 2014-02-21: qty 3

## 2014-02-21 MED ORDER — PREDNISONE 50 MG PO TABS
ORAL_TABLET | ORAL | Status: AC
  Filled 2014-02-21: qty 1

## 2014-02-21 NOTE — ED Notes (Signed)
Patient complaining of shortness of breath that started today at approximately 1700. History of asthma. States inhaler ran out.

## 2014-02-21 NOTE — ED Provider Notes (Signed)
CSN: [id number]     Arrival date & time 02/21/14  1932 History   First MD Initiated Contact with Patient 02/21/14 1952     Chief Complaint  Patient presents with  . Shortness of Breath  . Asthma     (Consider location/radiation/quality/duration/timing/severity/associated sxs/prior Treatment) HPI Comments: Patient presents with wheezing and shortness of breath onset around 5 PM. She has a history of asthma and is out of her albuterol. She normally uses a once a day. She is not taking her Singulair because she cannot afford it. She was seen in the ED 2 weeks ago for similar presentation. She denies any chest pain, fever. Her cough is nonproductive. No leg pain or leg swelling. She does not smoke.  The history is provided by the patient.    Past Medical History  Diagnosis Date  . Asthma   . Arthritis   . IBS (irritable bowel syndrome)   . Hypoglycemia   . Anemia    Past Surgical History  Procedure Laterality Date  . Mouth surgery      removal of teeth  . Tubal ligation    . Knee surgery  05/2011    rtight knee scope chondroplasty patella  . Abdominal hysterectomy    . Knee arthroscopy  09/24/2011    Procedure: ARTHROSCOPY KNEE;  Surgeon: [name], MD;  Location: [facility];  Service: Orthopedics;  Laterality: Left;  . Chondroplasty  09/24/2011    Procedure: CHONDROPLASTY;  Surgeon: [name], MD;  Location: [facility];  Service: Orthopedics;  Laterality: Left;  Chondroplasty Patella   Family History  Problem Relation Age of Onset  . Heart disease    . Arthritis    . Cancer    . Asthma    . Diabetes    . Anesthesia problems Neg Hx   . Hypotension Neg Hx   . Malignant hyperthermia Neg Hx   . Pseudochol deficiency Neg Hx    History  Substance Use Topics  . Smoking status: Never Smoker   . Smokeless tobacco: Not on file  . Alcohol Use: No   OB History   Grav Para Term Preterm Abortions TAB SAB Ect Mult Living                 Review of Systems   Constitutional: Negative for fever, activity change and appetite change.  Respiratory: Positive for cough and shortness of breath. Negative for chest tightness.   Cardiovascular: Negative for chest pain.  Gastrointestinal: Negative for nausea, vomiting and abdominal pain.  Genitourinary: Negative for dysuria, hematuria, vaginal bleeding and vaginal discharge.  Musculoskeletal: Negative for arthralgias and back pain.  Neurological: Negative for dizziness and headaches.  A complete 10 system review of systems was obtained and all systems are negative except as noted in the HPI and PMH.      Allergies  Review of patient's allergies indicates no known allergies.  Home Medications   Current Outpatient Rx  Name  Route  Sig  Dispense  Refill  . albuterol (PROAIR HFA) 108 (90 BASE) MCG/ACT inhaler   Inhalation   Inhale 2 puffs into the lungs every 6 (six) hours as needed for wheezing or shortness of breath.         . Melatonin 3 MG TABS   Oral   Take 3 mg by mouth at bedtime.         . predniSONE (DELTASONE) 50 MG tablet      1 tablet PO daily   5 tablet  0    BP 119/73  Pulse 98  Temp(Src) 98.3 F (36.8 C) (Oral)  Resp 28  Ht 5\' 7"  (1.702 m)  Wt 215 lb (97.523 kg)  BMI 33.67 kg/m2  SpO2 98% Physical Exam  Constitutional: She is oriented to person, place, and time. She appears well-developed and well-nourished. She appears distressed.  HENT:  Head: Normocephalic and atraumatic.  Mouth/Throat: Oropharynx is clear and moist. No oropharyngeal exudate.  Eyes: Conjunctivae and EOM are normal. Pupils are equal, round, and reactive to light.  Neck: Normal range of motion. Neck supple.  Cardiovascular: Normal rate, regular rhythm and normal heart sounds.   Pulmonary/Chest: She is in respiratory distress. She has wheezes.  Expiratory wheezing throughout  Abdominal: Soft. There is no tenderness. There is no rebound and no guarding.  Musculoskeletal: Normal range of motion.  She exhibits no edema and no tenderness.  Neurological: She is alert and oriented to person, place, and time. No cranial nerve deficit. She exhibits normal muscle tone. Coordination normal.  Skin: Skin is warm.    ED Course  Procedures (including critical care time) Labs Review Labs Reviewed - No data to display Imaging Review Dg Chest 2 View  02/21/2014   CLINICAL DATA:  Shortness of breath  EXAM: CHEST  2 VIEW  COMPARISON:  02/09/2014  FINDINGS: The heart size and mediastinal contours are within normal limits. Both lungs are clear. The visualized skeletal structures are unremarkable.  IMPRESSION: No active cardiopulmonary disease.   Electronically Signed   By: Alcide CleverMark  Lukens M.D.   On: 02/21/2014 20:29     EKG Interpretation None      MDM   Final diagnoses:  Asthma exacerbation   Wheezing and shortness of breath with history of asthma. Out of inhaler. No chest pain or fever.  Patient given nebulizer and steroid on arrival. She is much improved after treatment. Wheezing has resolved. She is speaking in full sentences. She is able to ambulate without desaturations. We'll refill inhaler. Prescribe short course of prednisone. Return precautions discussed.   BP 119/73  Pulse 98  Temp(Src) 98.3 F (36.8 C) (Oral)  Resp 28  Ht 5\' 7"  (1.702 m)  Wt 215 lb (97.523 kg)  BMI 33.67 kg/m2  SpO2 98%    Glynn OctaveStephen Davon Folta, MD 02/21/14 2136

## 2014-02-21 NOTE — Discharge Instructions (Signed)
Asthma, Adult Asthma is a recurring condition in which the airways tighten and narrow. Asthma can make it difficult to breathe. It can cause coughing, wheezing, and shortness of breath. Asthma episodes (also called asthma attacks) range from minor to life-threatening. Asthma cannot be cured, but medicines and lifestyle changes can help control it. CAUSES Asthma is believed to be caused by inherited (genetic) and environmental factors, but its exact cause is unknown. Asthma may be triggered by allergens, lung infections, or irritants in the air. Asthma triggers are different for each person. Common triggers include:   Animal dander.  Dust mites.  Cockroaches.  Pollen from trees or grass.  Mold.  Smoke.  Air pollutants such as dust, household cleaners, hair sprays, aerosol sprays, paint fumes, strong chemicals, or strong odors.  Cold air, weather changes, and winds (which increase molds and pollens in the air).  Strong emotional expressions such as crying or laughing hard.  Stress.  Certain medicines (such as aspirin) or types of drugs (such as beta-blockers).  Sulfites in foods and drinks. Foods and drinks that may contain sulfites include dried fruit, potato chips, and sparkling grape juice.  Infections or inflammatory conditions such as the flu, a cold, or an inflammation of the nasal membranes (rhinitis).  Gastroesophageal reflux disease (GERD).  Exercise or strenuous activity. SYMPTOMS Symptoms may occur immediately after asthma is triggered or many hours later. Symptoms include:  Wheezing.  Excessive nighttime or early morning coughing.  Frequent or severe coughing with a common cold.  Chest tightness.  Shortness of breath. DIAGNOSIS  The diagnosis of asthma is made by a review of your medical history and a physical exam. Tests may also be performed. These may include:  Lung function studies. These tests show how much air you breath in and out.  Allergy  tests.  Imaging tests such as X-rays. TREATMENT  Asthma cannot be cured, but it can usually be controlled. Treatment involves identifying and avoiding your asthma triggers. It also involves medicines. There are 2 classes of medicine used for asthma treatment:   Controller medicines. These prevent asthma symptoms from occurring. They are usually taken every day.  Reliever or rescue medicines. These quickly relieve asthma symptoms. They are used as needed and provide short-term relief. Your health care provider will help you create an asthma action plan. An asthma action plan is a written plan for managing and treating your asthma attacks. It includes a list of your asthma triggers and how they may be avoided. It also includes information on when medicines should be taken and when their dosage should be changed. An action plan may also involve the use of a device called a peak flow meter. A peak flow meter measures how well the lungs are working. It helps you monitor your condition. HOME CARE INSTRUCTIONS   Take medicine as directed by your health care provider. Speak with your health care provider if you have questions about how or when to take the medicines.  Use a peak flow meter as directed by your health care provider. Record and keep track of readings.  Understand and use the action plan to help minimize or stop an asthma attack without needing to seek medical care.  Control your home environment in the following ways to help prevent asthma attacks:  Do not smoke. Avoid being exposed to secondhand smoke.  Change your heating and air conditioning filter regularly.  Limit your use of fireplaces and wood stoves.  Get rid of pests (such as roaches and   mice) and their droppings.  Throw away plants if you see mold on them.  Clean your floors and dust regularly. Use unscented cleaning products.  Try to have someone else vacuum for you regularly. Stay out of rooms while they are being  vacuumed and for a short while afterward. If you vacuum, use a dust mask from a hardware store, a double-layered or microfilter vacuum cleaner bag, or a vacuum cleaner with a HEPA filter.  Replace carpet with wood, tile, or vinyl flooring. Carpet can trap dander and dust.  Use allergy-proof pillows, mattress covers, and box spring covers.  Wash bed sheets and blankets every week in hot water and dry them in a dryer.  Use blankets that are made of polyester or cotton.  Clean bathrooms and kitchens with bleach. If possible, have someone repaint the walls in these rooms with mold-resistant paint. Keep out of the rooms that are being cleaned and painted.  Wash hands frequently. SEEK MEDICAL CARE IF:   You have wheezing, shortness of breath, or a cough even if taking medicine to prevent attacks.  The colored mucus you cough up (sputum) is thicker than usual.  Your sputum changes from clear or white to yellow, green, gray, or bloody.  You have any problems that may be related to the medicines you are taking (such as a rash, itching, swelling, or trouble breathing).  You are using a reliever medicine more than 2 3 times per week.  Your peak flow is still at 50 79% of you personal best after following your action plan for 1 hour. SEEK IMMEDIATE MEDICAL CARE IF:   You seem to be getting worse and are unresponsive to treatment during an asthma attack.  You are short of breath even at rest.  You get short of breath when doing very little physical activity.  You have difficulty eating, drinking, or talking due to asthma symptoms.  You develop chest pain.  You develop a fast heartbeat.  You have a bluish color to your lips or fingernails.  You are lightheaded, dizzy, or faint.  Your peak flow is less than 50% of your personal best.  You have a fever or persistent symptoms for more than 2 3 days.  You have a fever and symptoms suddenly get worse. MAKE SURE YOU:   Understand these  instructions.  Will watch your condition.  Will get help right away if you are not doing well or get worse. Document Released: 11/29/2005 Document Revised: 08/01/2013 Document Reviewed: 06/28/2013 [employer] Patient Information 2014 [employer].  

## 2014-03-13 ENCOUNTER — Emergency Department (HOSPITAL_COMMUNITY)
Admission: EM | Admit: 2014-03-13 | Discharge: 2014-03-14 | Disposition: A | Discharge status: Home / Self Care | Payer: Self-pay | Attending: Emergency Medicine | Admitting: Emergency Medicine

## 2014-03-13 ENCOUNTER — Emergency Department (HOSPITAL_COMMUNITY): Payer: Self-pay

## 2014-03-13 ENCOUNTER — Encounter (HOSPITAL_COMMUNITY): Payer: Self-pay | Admitting: Emergency Medicine

## 2014-03-13 DIAGNOSIS — Z79899 Other long term (current) drug therapy: Secondary | ICD-10-CM | POA: Insufficient documentation

## 2014-03-13 DIAGNOSIS — J45901 Unspecified asthma with (acute) exacerbation: Secondary | ICD-10-CM | POA: Insufficient documentation

## 2014-03-13 DIAGNOSIS — Z8639 Personal history of other endocrine, nutritional and metabolic disease: Secondary | ICD-10-CM | POA: Insufficient documentation

## 2014-03-13 DIAGNOSIS — Z8739 Personal history of other diseases of the musculoskeletal system and connective tissue: Secondary | ICD-10-CM | POA: Insufficient documentation

## 2014-03-13 DIAGNOSIS — Z862 Personal history of diseases of the blood and blood-forming organs and certain disorders involving the immune mechanism: Secondary | ICD-10-CM | POA: Insufficient documentation

## 2014-03-13 DIAGNOSIS — Z8719 Personal history of other diseases of the digestive system: Secondary | ICD-10-CM | POA: Insufficient documentation

## 2014-03-13 MED ORDER — IPRATROPIUM-ALBUTEROL 0.5-2.5 (3) MG/3ML IN SOLN
3.0000 mL | Freq: Once | RESPIRATORY_TRACT | Status: AC
  Administered 2014-03-13: 3 mL via RESPIRATORY_TRACT
  Filled 2014-03-13: qty 3

## 2014-03-13 NOTE — ED Provider Notes (Signed)
CSN: [id number]     Arrival date & time 03/13/14  2037 History  This chart was scribed for Gerhard Munch, MD by Bailey Milch, ED Scribe. This patient was seen in room [facility]15[facility] and the patient's care was started at 11:57 PM.    Chief Complaint  Patient presents with  . Wheezing      The history is provided by the patient. No language interpreter was used.   HPI Comments: Bailey Mcdonald is a 43 y.o. female who presents to the Emergency Department complaining of wheezing that began about 6 hours ago. She reports associated chest tightness and that she was having an asthma attack. She states she was on singular to treat her asthma, but since her insurance was cut last year, she's only been able to get her medicine from the ED. She denies chest pain, nausea, and vomiting. She states her symptoms have gradually improved since her arrival to the ED. With albuterol treatment Past Medical History  Diagnosis Date  . Asthma   . Arthritis   . IBS (irritable bowel syndrome)   . Hypoglycemia   . Anemia    Past Surgical History  Procedure Laterality Date  . Mouth surgery      removal of teeth  . Tubal ligation    . Knee surgery  05/2011    rtight knee scope chondroplasty patella  . Abdominal hysterectomy    . Knee arthroscopy  09/24/2011    Procedure: ARTHROSCOPY KNEE;  Surgeon: [name], MD;  Location: [facility];  Service: Orthopedics;  Laterality: Left;  . Chondroplasty  09/24/2011    Procedure: CHONDROPLASTY;  Surgeon: [name], MD;  Location: [facility];  Service: Orthopedics;  Laterality: Left;  Chondroplasty Patella   Family History  Problem Relation Age of Onset  . Heart disease    . Arthritis    . Cancer    . Asthma    . Diabetes    . Anesthesia problems Neg Hx   . Hypotension Neg Hx   . Malignant hyperthermia Neg Hx   . Pseudochol deficiency Neg Hx    History  Substance Use Topics  . Smoking status: Never Smoker   . Smokeless tobacco: Not on file  .  Alcohol Use: No   OB History   Grav Para Term Preterm Abortions TAB SAB Ect Mult Living                 Review of Systems  Constitutional:       Per HPI, otherwise negative  HENT:       Per HPI, otherwise negative  Respiratory:       Per HPI, otherwise negative  Cardiovascular:       Per HPI, otherwise negative  Gastrointestinal: Negative for vomiting.  Endocrine:       Negative aside from HPI  Genitourinary:       Neg aside from HPI   Musculoskeletal:       Per HPI, otherwise negative  Skin: Negative.   Neurological: Negative for syncope.      Allergies  Review of patient's allergies indicates no known allergies.  Home Medications   Current Outpatient Rx  Name  Route  Sig  Dispense  Refill  . Melatonin 3 MG TABS   Oral   Take 3 mg by mouth at bedtime.         Marland Kitchen albuterol (PROAIR HFA) 108 (90 BASE) MCG/ACT inhaler   Inhalation   Inhale 2 puffs into the lungs  every 6 (six) hours as needed for wheezing or shortness of breath.          Triage Vitals: BP 107/73  Pulse 102  Temp(Src) 98 F (36.7 C) (Oral)  Resp 18  Ht 5\' 8"  (1.727 m)  Wt 215 lb (97.523 kg)  BMI 32.70 kg/m2  SpO2 95%  Physical Exam  Nursing note and vitals reviewed. Constitutional: She is oriented to person, place, and time. She appears well-developed and well-nourished. No distress.  HENT:  Head: Normocephalic and atraumatic.  Eyes: Conjunctivae and EOM are normal.  Cardiovascular: Normal rate and regular rhythm.   Pulmonary/Chest: Effort normal and breath sounds normal. No stridor. No respiratory distress.  Abdominal: She exhibits no distension.  Musculoskeletal: She exhibits no edema.  Neurological: She is alert and oriented to person, place, and time. No cranial nerve deficit.  Skin: Skin is warm and dry.  Psychiatric: She has a normal mood and affect.    ED Course  Procedures (including critical care time)  DIAGNOSTIC STUDIES: Oxygen Saturation is 100% on RA, normal by my  interpretation.    COORDINATION OF CARE: 12:00 AM- Pt's parents advised of plan for treatment. Parents verbalize understanding and agreement with plan.     Labs Review Labs Reviewed - No data to display Imaging Review Dg Chest 2 View  03/13/2014   CLINICAL DATA:  WHEEZING  EXAM: CHEST  2 VIEW  COMPARISON:  DG CHEST 2 VIEW dated 02/21/2014  FINDINGS: The heart size and mediastinal contours are within normal limits. Both lungs are clear. The visualized skeletal structures are unremarkable.  IMPRESSION: No active cardiopulmonary disease.   Electronically Signed   By: Salome HolmesHector  Cooper M.D.   On: 03/13/2014 22:46    MDM   Final diagnoses:  Asthma exacerbation   I personally performed the services described in this documentation, which was scribed in my presence. The recorded information has been reviewed and is accurate.  Patient history of asthma presents with cartridge to a typical asthma attack.  Patient with you with albuterol, has no evidence of distress, no chest pain, was discharged in stable condition after being provided albuterol, steroids, resource guide to obtain a new primary care physician.   Gerhard Munchobert Briseida Gittings, MD 03/14/14 0005

## 2014-03-13 NOTE — ED Notes (Signed)
Patient c/o wheezing that started at 1730 and states used last of her MDI this morning.

## 2014-03-14 MED ORDER — ALBUTEROL SULFATE HFA 108 (90 BASE) MCG/ACT IN AERS
2.0000 | INHALATION_SPRAY | Freq: Four times a day (QID) | RESPIRATORY_TRACT | Status: DC
  Filled 2014-03-14: qty 6.7

## 2014-03-14 MED ORDER — PREDNISONE 50 MG PO TABS
60.0000 mg | ORAL_TABLET | ORAL | Status: AC
  Administered 2014-03-14: 60 mg via ORAL
  Filled 2014-03-14 (×2): qty 1

## 2014-03-14 MED ORDER — PREDNISONE 20 MG PO TABS: 20.0000 mg | ORAL_TABLET | Freq: Every day | ORAL | Status: DC

## 2014-03-14 NOTE — Discharge Instructions (Signed)
As discussed, your evaluation today has been largely reassuring.  But, it is important that you monitor your condition carefully, and do not hesitate to return to the ED if you develop new, or concerning changes in your condition.  Otherwise, please follow-up with a new physician for appropriate ongoing care.   Asthma Attack Prevention Although there is no way to prevent asthma from starting, you can take steps to control the disease and reduce its symptoms. Learn about your asthma and how to control it. Take an active role to control your asthma by working with your health care provider to create and follow an asthma action plan. An asthma action plan guides you in:  Taking your medicines properly.  Avoiding things that set off your asthma or make your asthma worse (asthma triggers).  Tracking your level of asthma control.  Responding to worsening asthma.  Seeking emergency care when needed. To track your asthma, keep records of your symptoms, check your peak flow number using a handheld device that shows how well air moves out of your lungs (peak flow meter), and get regular asthma checkups.  WHAT ARE SOME WAYS TO PREVENT AN ASTHMA ATTACK?  Take medicines as directed by your health care provider.  Keep track of your asthma symptoms and level of control.  With your health care provider, write a detailed plan for taking medicines and managing an asthma attack. Then be sure to follow your action plan. Asthma is an ongoing condition that needs regular monitoring and treatment.  Identify and avoid asthma triggers. Many outdoor allergens and irritants (such as pollen, mold, cold air, and air pollution) can trigger asthma attacks. Find out what your asthma triggers are and take steps to avoid them.  Monitor your breathing. Learn to recognize warning signs of an attack, such as coughing, wheezing, or shortness of breath. Your lung function may decrease before you notice any signs or symptoms,  so regularly measure and record your peak airflow with a home peak flow meter.  Identify and treat attacks early. If you act quickly, you are less likely to have a severe attack. You will also need less medicine to control your symptoms. When your peak flow measurements decrease and alert you to an upcoming attack, take your medicine as instructed and immediately stop any activity that may have triggered the attack. If your symptoms do not improve, get medical help.  Pay attention to increasing quick-relief inhaler use. If you find yourself relying on your quick-relief inhaler, your asthma is not under control. See your health care provider about adjusting your treatment. WHAT CAN MAKE MY SYMPTOMS WORSE? A number of common things can set off or make your asthma symptoms worse and cause temporary increased inflammation of your airways. Keep track of your asthma symptoms for several weeks, detailing all the environmental and emotional factors that are linked with your asthma. When you have an asthma attack, go back to your asthma diary to see which factor, or combination of factors, might have contributed to it. Once you know what these factors are, you can take steps to control many of them. If you have allergies and asthma, it is important to take asthma prevention steps at home. Minimizing contact with the substance to which you are allergic will help prevent an asthma attack. Some triggers and ways to avoid these triggers are: Animal Dander:  Some people are allergic to the flakes of skin or dried saliva from animals with fur or feathers.   There is no  such thing as a hypoallergenic dog or cat breed. All dogs or cats can cause allergies, even if they don't shed.  Keep these pets out of your home.  If you are not able to keep a pet outdoors, keep the pet out of your bedroom and other sleeping areas at all times, and keep the door closed.  Remove carpets and furniture covered with cloth from your  home. If that is not possible, keep the pet away from fabric-covered furniture and carpets. Dust Mites: Many people with asthma are allergic to dust mites. Dust mites are tiny bugs that are found in every home in mattresses, pillows, carpets, fabric-covered furniture, bedcovers, clothes, stuffed toys, and other fabric-covered items.   Cover your mattress in a special dust-proof cover.  Cover your pillow in a special dust-proof cover, or wash the pillow each week in hot water. Water must be hotter than 130 F (54.4 C) to kill dust mites. Cold or warm water used with detergent and bleach can also be effective.  Wash the sheets and blankets on your bed each week in hot water.  Try not to sleep or lie on cloth-covered cushions.  Call ahead when traveling and ask for a smoke-free hotel room. Bring your own bedding and pillows in case the hotel only supplies feather pillows and down comforters, which may contain dust mites and cause asthma symptoms.  Remove carpets from your bedroom and those laid on concrete, if you can.  Keep stuffed toys out of the bed, or wash the toys weekly in hot water or cooler water with detergent and bleach. Cockroaches: Many people with asthma are allergic to the droppings and remains of cockroaches.   Keep food and garbage in closed containers. Never leave food out.  Use poison baits, traps, powders, gels, or paste (for example, boric acid).  If a spray is used to kill cockroaches, stay out of the room until the odor goes away. Indoor Mold:  Fix leaky faucets, pipes, or other sources of water that have mold around them.  Clean floors and moldy surfaces with a fungicide or diluted bleach.  Avoid using humidifiers, vaporizers, or swamp coolers. These can spread molds through the air. Pollen and Outdoor Mold:  When pollen or mold spore counts are high, try to keep your windows closed.  Stay indoors with windows closed from late morning to afternoon. Pollen and  some mold spore counts are highest at that time.  Ask your health care provider whether you need to take anti-inflammatory medicine or increase your dose of the medicine before your allergy season starts. Other Irritants to Avoid:  Tobacco smoke is an irritant. If you smoke, ask your health care provider how you can quit. Ask family members to quit smoking too. Do not allow smoking in your home or car.  If possible, do not use a wood-burning stove, kerosene heater, or fireplace. Minimize exposure to all sources of smoke, including to incense, candles, fires, and fireworks.  Try to stay away from strong odors and sprays, such as perfume, talcum powder, hair spray, and paints.  Decrease humidity in your home and use an indoor air cleaning device. Reduce indoor humidity to below 60%. Dehumidifiers or central air conditioners can do this.  Decrease house dust exposure by changing furnace and air cooler filters frequently.  Try to have someone else vacuum for you once or twice a week. Stay out of rooms while they are being vacuumed and for a short while afterward.  If you  vacuum, use a dust mask from a hardware store, a double-layered or microfilter vacuum cleaner bag, or a vacuum cleaner with a HEPA filter.  Sulfites in foods and beverages can be irritants. Do not drink beer or wine or eat dried fruit, processed potatoes, or shrimp if they cause asthma symptoms.  Cold air can trigger an asthma attack. Cover your nose and mouth with a scarf on cold or windy days.  Several health conditions can make asthma more difficult to manage, including a runny nose, sinus infections, reflux disease, psychological stress, and sleep apnea. Work with your health care provider to manage these conditions.  Avoid close contact with people who have a respiratory infection such as a cold or the flu, since your asthma symptoms may get worse if you catch the infection. Wash your hands thoroughly after touching items  that may have been handled by people with a respiratory infection.  Get a flu shot every year to protect against the flu virus, which often makes asthma worse for days or weeks. Also get a pneumonia shot if you have not previously had one. Unlike the flu shot, the pneumonia shot does not need to be given yearly. Medicines:  Talk to your health care provider about whether it is safe for you to take aspirin or non-steroidal anti-inflammatory medicines (NSAIDs). In a small number of people with asthma, aspirin and NSAIDs can cause asthma attacks. These medicines must be avoided by people who have known aspirin-sensitive asthma. It is important that people with aspirin-sensitive asthma read labels of all over-the-counter medicines used to treat pain, colds, coughs, and fever.  Beta blockers and ACE inhibitors are other medicines you should discuss with your health care provider. HOW CAN I FIND OUT WHAT I AM ALLERGIC TO? Ask your asthma health care provider about allergy skin testing or blood testing (the RAST test) to identify the allergens to which you are sensitive. If you are found to have allergies, the most important thing to do is to try to avoid exposure to any allergens that you are sensitive to as much as possible. Other treatments for allergies, such as medicines and allergy shots (immunotherapy) are available.  CAN I EXERCISE? Follow your health care provider's advice regarding asthma treatment before exercising. It is important to maintain a regular exercise program, but vigorous exercise, or exercise in cold, humid, or dry environments can cause asthma attacks, especially for those people who have exercise-induced asthma. Document Released: 11/17/2009 Document Revised: 08/01/2013 Document Reviewed: 06/06/2013 [facility] Patient Information 2014 [place], [place].    Emergency Department Resource Guide 1) Find a Doctor and Pay Out of Pocket Although you won't have to find out who is covered by  your insurance plan, it is a good idea to ask around and get recommendations. You will then need to call the office and see if the doctor you have chosen will accept you as a new patient and what types of options they offer for patients who are self-pay. Some doctors offer discounts or will set up payment plans for their patients who do not have insurance, but you will need to ask so you aren't surprised when you get to your appointment.  2) Contact Your Local Health Department Not all health departments have doctors that can see patients for sick visits, but many do, so it is worth a call to see if yours does. If you don't know where your local health department is, you can check in your phone book. The CDC also has  a tool to help you locate your state's health department, and many state websites also have listings of all of their local health departments.  3) Find a Walk-in Clinic If your illness is not likely to be very severe or complicated, you may want to try a walk in clinic. These are popping up all over the country in pharmacies, drugstores, and shopping centers. They're usually staffed by nurse practitioners or physician assistants that have been trained to treat common illnesses and complaints. They're usually fairly quick and inexpensive. However, if you have serious medical issues or chronic medical problems, these are probably not your best option.  No Primary Care Doctor: - Call Health Connect at  [phone] - they can help you locate a primary care doctor that  accepts your insurance, provides certain services, etc. - Physician Referral Service- [phone]  Chronic Pain Problems: Organization         Address  Phone   Notes  [facility]  [phone] Patients need to be referred by their primary care doctor.   Medication Assistance: Organization         Address  Phone   Notes  [facility] Medication [facility] [address] [place] [place], [place] [address] [phone] --Must be a resident of [facility] -- Must have NO insurance coverage whatsoever (no Medicaid/ Medicare, etc.) -- The pt. MUST have a primary care doctor that directs their care regularly and follows them in the community   [facility]ist  [phone]   [employer]  [phone]    Agencies that provide inexpensive medical care: Organization         Address  Phone   Notes  [facility]  [phone]   [facility]    [phone]   [facility] [address] [place], [place] [address] [phone]   [facility] [place] [address] [place], [place] [phone]   Planned Parenthood    [phone]   [facility]    [phone]   [facility]  [address], [place] Phone:  [phone], Fax:  [phone] Hours of Operation:  9 am - 6 pm, M-F.  Also accepts Medicaid/Medicare and self-pay.  [facility]  [address], [place] Phone: [phone], Fax: [phone]. Hours of Operation:  8:30 am - 5:30 pm, M-F.  Also accepts Medicaid and self-pay.  [facility] [place] [address], [place] Phone: [phone]   [facility] [address] Natasha Bence [place], [place] [phone], Ext. [phone] Mondays & Thursdays: 7-9 AM.  First 15 patients are seen on a first come, first serve basis.    Medicaid-accepting [facility] Providers:  Organization         Address  Phone   Notes  [facility] [address], [place] [phone] Also accepts self-pay patients.  [facility] [address] Laurell Josephs [place], [place]  [phone]   [facility] [address], [place] [phone]   [facility] [address] [place] [phone]   Renaye Rakers [address], [place]   [phone] Only accepts [place] Access [place] patients after they have their name applied to their card.   Self-Pay (no insurance) in [place]  County:  Organization         Address  Phone   Notes  Sickle Cell Patients, [facility] [address] [place], [place] [phone]   [facility] [address] [place], [place] [phone]   [facility] [place]  [address], [place] [phone]   [facility]/Dr. Osei-Bonsu  [address] [place] or [address], [place] [phone] Phone number for both [place] and [place] locations is the same.  [facility] [address], [place] [phone]   [facility] [address] [place] or [address] [phone]   [facility] [address] [place] [phone], phone; [phone], fax Sees patients 1st and 3rd Saturday of every month.  Must not qualify for public or private insurance (i.e. Medicaid, Medicare, [place] Health Choice, Veterans' Benefits)  Household income should be no more than 200% of the poverty level The clinic cannot treat you if you are pregnant or think you are pregnant  Sexually transmitted diseases are not treated at the clinic.   Dental Care: Organization         Address  Phone  Notes  [facility] Department of [facility] [place] [address] [place], [place] [phone] Accepts children up to age 15 who are enrolled in [place] or [place] Health Choice; pregnant women with a Medicaid card; and children who have applied for Medicaid or [place] Health Choice, but were declined, whose parents can pay a reduced fee at time of service.  [facility] Department of [facility]  [address], [place] [phone] Accepts children up to age 81 who are enrolled in  [place] or [place] Health Choice; pregnant women with a Medicaid card; and children who have applied for Medicaid or [place] Health Choice, but were declined, whose parents can pay a reduced fee at time of service.  [facility]  [address] [place], [place] [phone] Patients are seen by appointment only. Walk-ins are not accepted. [facility] will see patients 61 years of age and older. Monday - Tuesday (8am-5pm) Most Wednesdays (8:30-5pm) $30 per visit, cash only  [facility] Adult Dental Access PRO[facility]  [address], [facility] [phone] Patients are seen by appointment only. Walk-ins are not accepted. [facility] will see patients 66 years of age and older. One Wednesday Evening (Monthly: Volunteer Based).  $30 per visit, cash only  [employer]  [phone] for adults; Children under age 20, call Graduate Pediatric Dentistry at [phone]. Children aged 46-14, please call [phone] to request a pediatric application.  Dental services are provided in all areas of dental care including fillings, crowns and bridges, complete and partial dentures, implants, gum treatment, root canals, and extractions. Preventive care is also provided. Treatment is provided to both adults and children. Patients are selected via a lottery and there is often a waiting list.   [facility] [address], [place]  [phone] [email or web address]   [facility] [address] [place], [place] [phone], Ext. [phone] Second and Fourth Thursday of each month, opens at 6:30 AM; Clinic ends at 9 AM.  Patients are seen on a first-come first-served basis, and a limited number are seen during each clinic.  [facility]  [address] Ether Griffins [place], [place] [phone]   Eligibility Requirements You must have lived in [place], [place], or [place] counties for at least the last three months.   You cannot be  eligible for state or federal sponsored [place], including [employer], [place], or [employer].   You generally cannot be eligible for healthcare insurance through your employer.    How to apply: Eligibility screenings are held every Tuesday and Wednesday afternoon from 1:00 pm until 4:00 pm. You do not need an appointment for the interview!  [facility] [address], [place], [place] [phone]   [facility]  [phone]   [facility] Health Department  [phone]   [facility] Health Department  [phone]    Behavioral Health Resources in the Community: Intensive Outpatient Programs Organization         Address  Phone  Notes  [facility] Services [address] [place], [place] [phone]   [facility] [address] [place], [place] [phone]   [facility] [address], [place], [place]  [phone]   [facility] Mental Health [address],  [employer] [employer], [place] [phone] or [phone]   Substance Abuse Resources Organization         Address  Phone  Notes  [facility]  [phone]   [facility]  [phone]   [facility] [place]  [phone]   Floydene Flock  [phone]   Residential & Outpatient Substance Abuse Program  [phone]   Psychological Services Organization         Address  Phone  Notes  [facility] Health  [phone]   [facility] Services  [phone]   [facility] Mental Health [address], [place] [phone] or [phone]    Mobile Crisis Teams Organization         Address  Phone  Notes  [facility], Mobile Crisis Care Unit  [phone]   [facility]  [address] [place], [place] [phone]   Doristine Locks [address] [place] [phone]    Self-Help/Support Groups Organization          Address  Phone             Notes  [facility] [place] - variety of support groups  336- I7437963 Call for more information  Narcotics Anonymous (NA), [facility] [address], [employer] [place]  2 meetings at this location   Statistician         Address  Phone  Notes  [facility] [address],    [place]  [phone]   [facility]  [address], [place] [address], [place], [place] [phone]   [facility] [address] [place], [place] [phone] Admissions: 8am-3pm M-F  [facility] [address]    [place], [place] [phone]   [facility] [address] Starling Manns [place], [place] [phone]   [facility] [address],  [place], [place] [phone]   [facility] - Intensive Outpatient [address] Laurell Josephs [address], [place], [place] [phone]   [facility] ([facility]iction [facility] [address] [place].,  [place], [place] [phone] or [phone]   [facility] ([facility]) [address] [place], [place] [phone] Accepts Medicaid  [facility] [address],  [place] [phone] Substance Abuse/Addiction  Treatment   [facility] Organization         Address  Phone  Notes  [facility]  [phone]   Angie Fava, PhD [address] Ervin Knack [place], [place]   [phone] or [phone]   [facility] Behavioral   [address] [place], [place] [phone]   [facility] [address] [place], [place] [phone] Insurance/Medicaid/sponsorship through [facility] and Families [address]                                    [place], [place] [phone] Therapy/tele-psych/case  [facility] [address], [place] [phone]    Dr. Lolly Mustache  [phone]   [facility]  [employer]  [facility] Dept. 1) [address], [place] 2) [address], [place] 3)  [address], [place] [phone]  [phone]   [facility] Child Abuse Hotline (442)311-9681 or [phone] (After Hours)

## 2014-03-18 ENCOUNTER — Encounter (HOSPITAL_COMMUNITY): Payer: Self-pay | Admitting: Emergency Medicine

## 2014-03-18 ENCOUNTER — Emergency Department (HOSPITAL_COMMUNITY)
Admission: EM | Admit: 2014-03-18 | Discharge: 2014-03-19 | Disposition: A | Discharge status: Home / Self Care | Payer: Self-pay | Attending: Emergency Medicine | Admitting: Emergency Medicine

## 2014-03-18 ENCOUNTER — Emergency Department (HOSPITAL_COMMUNITY): Payer: Self-pay

## 2014-03-18 DIAGNOSIS — Z8639 Personal history of other endocrine, nutritional and metabolic disease: Secondary | ICD-10-CM | POA: Insufficient documentation

## 2014-03-18 DIAGNOSIS — Z862 Personal history of diseases of the blood and blood-forming organs and certain disorders involving the immune mechanism: Secondary | ICD-10-CM | POA: Insufficient documentation

## 2014-03-18 DIAGNOSIS — J45909 Unspecified asthma, uncomplicated: Secondary | ICD-10-CM

## 2014-03-18 DIAGNOSIS — Z8739 Personal history of other diseases of the musculoskeletal system and connective tissue: Secondary | ICD-10-CM | POA: Insufficient documentation

## 2014-03-18 DIAGNOSIS — Z8719 Personal history of other diseases of the digestive system: Secondary | ICD-10-CM | POA: Insufficient documentation

## 2014-03-18 DIAGNOSIS — J45901 Unspecified asthma with (acute) exacerbation: Secondary | ICD-10-CM | POA: Insufficient documentation

## 2014-03-18 DIAGNOSIS — IMO0002 Reserved for concepts with insufficient information to code with codable children: Secondary | ICD-10-CM | POA: Insufficient documentation

## 2014-03-18 DIAGNOSIS — Z79899 Other long term (current) drug therapy: Secondary | ICD-10-CM | POA: Insufficient documentation

## 2014-03-18 MED ORDER — ALBUTEROL SULFATE (2.5 MG/3ML) 0.083% IN NEBU
2.5000 mg | INHALATION_SOLUTION | Freq: Once | RESPIRATORY_TRACT | Status: AC
  Administered 2014-03-18: 2.5 mg via RESPIRATORY_TRACT

## 2014-03-18 MED ORDER — IPRATROPIUM-ALBUTEROL 0.5-2.5 (3) MG/3ML IN SOLN
3.0000 mL | Freq: Once | RESPIRATORY_TRACT | Status: AC
  Administered 2014-03-18: 3 mL via RESPIRATORY_TRACT
  Filled 2014-03-18: qty 3

## 2014-03-18 MED ORDER — ALBUTEROL SULFATE (2.5 MG/3ML) 0.083% IN NEBU
2.5000 mg | INHALATION_SOLUTION | Freq: Once | RESPIRATORY_TRACT | Status: DC
  Filled 2014-03-18: qty 3

## 2014-03-18 MED ORDER — ALBUTEROL SULFATE HFA 108 (90 BASE) MCG/ACT IN AERS: 1.0000 | INHALATION_SPRAY | Freq: Four times a day (QID) | RESPIRATORY_TRACT | Status: DC | PRN

## 2014-03-18 MED ORDER — ALBUTEROL SULFATE (2.5 MG/3ML) 0.083% IN NEBU
2.5000 mg | INHALATION_SOLUTION | Freq: Once | RESPIRATORY_TRACT | Status: AC
  Administered 2014-03-18: 2.5 mg via RESPIRATORY_TRACT
  Filled 2014-03-18: qty 3

## 2014-03-18 MED ORDER — METHYLPREDNISOLONE SODIUM SUCC 125 MG IJ SOLR
125.0000 mg | Freq: Once | INTRAMUSCULAR | Status: AC
  Administered 2014-03-18: 125 mg via INTRAVENOUS
  Filled 2014-03-18: qty 2

## 2014-03-18 MED ORDER — SODIUM CHLORIDE 0.9 % IV BOLUS (SEPSIS)
500.0000 mL | Freq: Once | INTRAVENOUS | Status: AC
  Administered 2014-03-18: 500 mL via INTRAVENOUS

## 2014-03-18 MED ORDER — IPRATROPIUM-ALBUTEROL 0.5-2.5 (3) MG/3ML IN SOLN
3.0000 mL | Freq: Once | RESPIRATORY_TRACT | Status: DC
  Filled 2014-03-18: qty 3

## 2014-03-18 MED ORDER — PREDNISONE 20 MG PO TABS: ORAL_TABLET | ORAL | Status: DC

## 2014-03-18 MED ORDER — IPRATROPIUM-ALBUTEROL 0.5-2.5 (3) MG/3ML IN SOLN
3.0000 mL | Freq: Once | RESPIRATORY_TRACT | Status: AC
  Administered 2014-03-18: 3 mL via RESPIRATORY_TRACT

## 2014-03-18 NOTE — ED Provider Notes (Signed)
CSN: [id number]     Arrival date & time 03/18/14  1647 History  This chart was scribed for Donnetta Hutching, MD by Bennett Scrape, ED Scribe. This patient was seen in room APA09/[facility]A09 and the patient's care was started at 5:55 PM.    Chief Complaint  Patient presents with  . Asthma    The history is provided by the patient. No language interpreter was used.    HPI Comments: Bailey Mcdonald is a 43 y.o. female with a h/o asthma who presents to the Emergency Department complaining of SOB with associated wheezing that started around 8:30 AM this morning. Pt states that the symptoms have been intermittent for most of the day but became significantly worse around 3 PM this afternoon. She states that she has tried using her albuterol inhaler x6 without relief. She denies having a nebulizer machine at home. She admits that this is her 5th ED visit in the past 4 months for the same. She states that her symptoms were controlled with albuterol and Singulair but reports that she lost her insurance and has been unable to afford her Singulair. She admits to needing prior steroid use in the past to improve her symptoms but denies any recent use. She denies smoking or other chronic health problems.  She denies any other complaints.   Past Medical History  Diagnosis Date  . Asthma   . Arthritis   . IBS (irritable bowel syndrome)   . Hypoglycemia   . Anemia    Past Surgical History  Procedure Laterality Date  . Mouth surgery      removal of teeth  . Tubal ligation    . Knee surgery  05/2011    rtight knee scope chondroplasty patella  . Abdominal hysterectomy    . Knee arthroscopy  09/24/2011    Procedure: ARTHROSCOPY KNEE;  Surgeon: [name], MD;  Location: [facility];  Service: Orthopedics;  Laterality: Left;  . Chondroplasty  09/24/2011    Procedure: CHONDROPLASTY;  Surgeon: [name], MD;  Location: [facility];  Service: Orthopedics;  Laterality: Left;  Chondroplasty Patella   Family History   Problem Relation Age of Onset  . Heart disease    . Arthritis    . Cancer    . Asthma    . Diabetes    . Anesthesia problems Neg Hx   . Hypotension Neg Hx   . Malignant hyperthermia Neg Hx   . Pseudochol deficiency Neg Hx    History  Substance Use Topics  . Smoking status: Never Smoker   . Smokeless tobacco: Not on file  . Alcohol Use: No   No OB history provided.  Review of Systems  A complete 10 system review of systems was obtained and all systems are negative except as noted in the HPI and PMH.    Allergies  Review of patient's allergies indicates no known allergies.  Home Medications   Current Outpatient Rx  Name  Route  Sig  Dispense  Refill  . albuterol (PROAIR HFA) 108 (90 BASE) MCG/ACT inhaler   Inhalation   Inhale 2 puffs into the lungs every 6 (six) hours as needed for wheezing or shortness of breath.         . Melatonin 3 MG TABS   Oral   Take 3 mg by mouth at bedtime.         . predniSONE (DELTASONE) 20 MG tablet   Oral   Take 1 tablet (20 mg total) by mouth daily.  12 tablet   0   . albuterol (PROVENTIL HFA;VENTOLIN HFA) 108 (90 BASE) MCG/ACT inhaler   Inhalation   Inhale 1-2 puffs into the lungs every 6 (six) hours as needed for wheezing or shortness of breath.   1 Inhaler   0   . predniSONE (DELTASONE) 20 MG tablet      3 tabs po day one, then 2 po daily x 4 days   11 tablet   0    Triage Vitals: BP 102/56  Pulse 117  Temp(Src) 100.7 F (38.2 C) (Oral)  Resp 26  Wt 215 lb (97.523 kg)  SpO2 97%  Physical Exam  Nursing note and vitals reviewed. Constitutional: She is oriented to person, place, and time. She appears well-developed and well-nourished.  HENT:  Head: Normocephalic and atraumatic.  Eyes: Conjunctivae and EOM are normal. Pupils are equal, round, and reactive to light.  Neck: Normal range of motion. Neck supple.  Cardiovascular: Normal rate, regular rhythm and normal heart sounds.   Pulmonary/Chest: Effort  normal. She has wheezes (moderate bilateral wheezing).  Abdominal: Soft. Bowel sounds are normal.  Musculoskeletal: Normal range of motion.  Neurological: She is alert and oriented to person, place, and time.  Skin: Skin is warm and dry.  Psychiatric: She has a normal mood and affect. Her behavior is normal.    ED Course  Procedures (including critical care time)  Medications  sodium chloride 0.9 % bolus 500 mL (0 mLs Intravenous Stopped 03/18/14 2255)  methylPREDNISolone sodium succinate (SOLU-MEDROL) 125 mg/2 mL injection 125 mg (125 mg Intravenous Given 03/18/14 1807)  ipratropium-albuterol (DUONEB) 0.5-2.5 (3) MG/3ML nebulizer solution 3 mL (3 mLs Nebulization Given 03/18/14 1803)  albuterol (PROVENTIL) (2.5 MG/3ML) 0.083% nebulizer solution 2.5 mg (2.5 mg Nebulization Given 03/18/14 1803)  ipratropium-albuterol (DUONEB) 0.5-2.5 (3) MG/3ML nebulizer solution 3 mL (3 mLs Nebulization Given 03/18/14 2046)  albuterol (PROVENTIL) (2.5 MG/3ML) 0.083% nebulizer solution 2.5 mg (2.5 mg Nebulization Given 03/18/14 2046)  ipratropium-albuterol (DUONEB) 0.5-2.5 (3) MG/3ML nebulizer solution 3 mL (3 mLs Nebulization Given 03/18/14 2312)  albuterol (PROVENTIL) (2.5 MG/3ML) 0.083% nebulizer solution 2.5 mg (2.5 mg Nebulization Given 03/18/14 2312)    DIAGNOSTIC STUDIES: Oxygen Saturation is 97% on RA, adequate by my interpretation.    COORDINATION OF CARE: 5:57 PM-Pt started on Derma O2. Discussed treatment plan which includes breathing tx, CXR and steroids IV with pt at bedside and pt agreed to plan.   9:51 PM-Informed pt of radiology and lab work results. Feels improved with ED treatments. Discussed discharge plan which includes Rx for steroids with pt and pt agreed to plan. Also advised pt to follow up as needed and pt agreed. Addressed symptoms to return for with pt.   Labs Review Labs Reviewed - No data to display Imaging Review Dg Chest Port 1 View  03/18/2014   CLINICAL DATA:  Wheezing.  Short of breath.   EXAM: PORTABLE CHEST - 1 VIEW  COMPARISON:  03/13/2014  FINDINGS: The heart size and mediastinal contours are within normal limits. Both lungs are clear. The visualized skeletal structures are unremarkable.  IMPRESSION: No active disease.   Electronically Signed   By: Amie Portland M.D.   On: 03/18/2014 18:13     EKG Interpretation   Date/Time:  Monday March 18 2014 17:58:31 EDT Ventricular Rate:  109 PR Interval:  126 QRS Duration: 88 QT Interval:  318 QTC Calculation: 428 R Axis:   -13 Text Interpretation:  Sinus tachycardia Otherwise normal ECG No previous  ECGs  available Confirmed by Adriana SimasOOK  MD, Jovan Colligan ([id number]) on 03/18/2014 6:06:32 PM      MDM   Final diagnoses:  Asthma   patient feels better after several breathing treatments.  IV steroids. Chest x-ray negative. Discharge medications prednisone and albuterol inhaler.  I personally performed the services described in this documentation, which was scribed in my presence. The recorded information has been reviewed and is accurate.     Donnetta HutchingBrian Liam Cammarata, MD 03/19/14 [phone]

## 2014-03-18 NOTE — ED Notes (Signed)
Patient ambulated to bathroom in her room. Upon returning patient complaining of shortness of breath and has audible expiratory wheezing. Advised Dr Adriana Simasook.

## 2014-03-18 NOTE — ED Notes (Signed)
Pt arrives with SOB since 0830 this morning, worsening as the day goes on. Hyperventilating, Audible wheezes, advised to breathe as slowly as she can, pt complied.

## 2014-03-19 NOTE — Discharge Instructions (Signed)
Prescription for prednisone and inhaler. Return if worse

## 2014-06-04 ENCOUNTER — Encounter (HOSPITAL_COMMUNITY): Payer: Self-pay | Admitting: Emergency Medicine

## 2014-06-04 ENCOUNTER — Emergency Department (HOSPITAL_COMMUNITY)
Admission: EM | Admit: 2014-06-04 | Discharge: 2014-06-04 | Disposition: A | Discharge status: Home / Self Care | Payer: Self-pay | Attending: Emergency Medicine | Admitting: Emergency Medicine

## 2014-06-04 DIAGNOSIS — Z8719 Personal history of other diseases of the digestive system: Secondary | ICD-10-CM | POA: Insufficient documentation

## 2014-06-04 DIAGNOSIS — Z8739 Personal history of other diseases of the musculoskeletal system and connective tissue: Secondary | ICD-10-CM | POA: Insufficient documentation

## 2014-06-04 DIAGNOSIS — J45901 Unspecified asthma with (acute) exacerbation: Secondary | ICD-10-CM | POA: Insufficient documentation

## 2014-06-04 DIAGNOSIS — Z79899 Other long term (current) drug therapy: Secondary | ICD-10-CM | POA: Insufficient documentation

## 2014-06-04 DIAGNOSIS — Z862 Personal history of diseases of the blood and blood-forming organs and certain disorders involving the immune mechanism: Secondary | ICD-10-CM | POA: Insufficient documentation

## 2014-06-04 DIAGNOSIS — Z8639 Personal history of other endocrine, nutritional and metabolic disease: Secondary | ICD-10-CM | POA: Insufficient documentation

## 2014-06-04 MED ORDER — PREDNISONE 20 MG PO TABS: ORAL_TABLET | ORAL | Status: DC

## 2014-06-04 MED ORDER — IPRATROPIUM BROMIDE 0.02 % IN SOLN
RESPIRATORY_TRACT | Status: AC
  Administered 2014-06-04: 0.5 mg
  Filled 2014-06-04: qty 2.5

## 2014-06-04 MED ORDER — ALBUTEROL SULFATE (2.5 MG/3ML) 0.083% IN NEBU
INHALATION_SOLUTION | RESPIRATORY_TRACT | Status: AC
  Administered 2014-06-04: 2.5 mg
  Filled 2014-06-04: qty 3

## 2014-06-04 MED ORDER — ALBUTEROL (5 MG/ML) CONTINUOUS INHALATION SOLN
INHALATION_SOLUTION | RESPIRATORY_TRACT | Status: AC
  Administered 2014-06-04: 10 mg
  Filled 2014-06-04: qty 20

## 2014-06-04 MED ORDER — IPRATROPIUM-ALBUTEROL 0.5-2.5 (3) MG/3ML IN SOLN
RESPIRATORY_TRACT | Status: AC
  Administered 2014-06-04: 3 mL
  Filled 2014-06-04: qty 3

## 2014-06-04 MED ORDER — PREDNISONE 50 MG PO TABS
60.0000 mg | ORAL_TABLET | Freq: Once | ORAL | Status: AC
  Administered 2014-06-04: 60 mg via ORAL
  Filled 2014-06-04 (×2): qty 1

## 2014-06-04 MED ORDER — ALBUTEROL SULFATE HFA 108 (90 BASE) MCG/ACT IN AERS
2.0000 | INHALATION_SPRAY | RESPIRATORY_TRACT | Status: DC | PRN
  Filled 2014-06-04: qty 6.7

## 2014-06-04 NOTE — ED Provider Notes (Signed)
CSN: [id number]     Arrival date & time 06/04/14  2038 History  This chart was scribed for Ward GivensIva L Knapp, MD by Leona CarryG. Clay Sherrill, ED Scribe. The patient was seen in APA07/APA07. The patient's care was started at 9:12 PM.     Chief Complaint  Patient presents with  . Asthma    Patient is a 43 y.o. female presenting with asthma. The history is provided by the patient. No language interpreter was used.  Asthma This is a chronic problem. The current episode started 3 to 5 hours ago. The problem has not changed since onset.Associated symptoms include shortness of breath. She has tried nothing for the symptoms.   HPI Comments: Bailey Mcdonald is a 43 y.o. female who presents to the Emergency Department complaining of severe SOB resulting from an asthma attack that began at approximately 5 hours ago. She states that she ran out of Proventil this morning. She used to be on Singulair but she ran out of that in March. Patient denies sore throat but has mild rhinorrhea that is clear. Patient denies a history of smoking and states that nobody in her household smokes. She reports that she works in [employer]. She denies fever, chills, nausea, vomiting, diarrhea. She reports her chest feels tight like it's hard to take a big deep breath. Patient states she gets steroids most the times that she has a flareup of her asthma.  Patient does not have a PCP. She reports that her insurance was cancelled in March.   Past Medical History  Diagnosis Date  . Asthma   . Arthritis   . IBS (irritable bowel syndrome)   . Hypoglycemia   . Anemia    Past Surgical History  Procedure Laterality Date  . Mouth surgery      removal of teeth  . Tubal ligation    . Knee surgery  05/2011    rtight knee scope chondroplasty patella  . Abdominal hysterectomy    . Knee arthroscopy  09/24/2011    Procedure: ARTHROSCOPY KNEE;  Surgeon: Fuller CanadaStanley Harrison, MD;  Location: [facility];  Service: Orthopedics;  Laterality: Left;  .  Chondroplasty  09/24/2011    Procedure: CHONDROPLASTY;  Surgeon: Fuller CanadaStanley Harrison, MD;  Location: [facility];  Service: Orthopedics;  Laterality: Left;  Chondroplasty Patella   Family History  Problem Relation Age of Onset  . Heart disease    . Arthritis    . Cancer    . Asthma    . Diabetes    . Anesthesia problems Neg Hx   . Hypotension Neg Hx   . Malignant hyperthermia Neg Hx   . Pseudochol deficiency Neg Hx    History  Substance Use Topics  . Smoking status: Never Smoker   . Smokeless tobacco: Not on file  . Alcohol Use: No   Employed No second hand smoke   OB History   Grav Para Term Preterm Abortions TAB SAB Ect Mult Living                 Review of Systems  Constitutional: Negative for fever and chills.  HENT: Positive for rhinorrhea and sore throat.   Respiratory: Positive for shortness of breath.   Gastrointestinal: Negative for nausea, vomiting and diarrhea.  All other systems reviewed and are negative.     Allergies  Review of patient's allergies indicates no known allergies.  Home Medications   Prior to Admission medications   Medication Sig Start Date End Date Taking? Authorizing Provider  albuterol (PROAIR HFA) 108 (90 BASE) MCG/ACT inhaler Inhale 2 puffs into the lungs every 6 (six) hours as needed for wheezing or shortness of breath.   Yes Historical Provider, MD  Melatonin 3 MG TABS Take 3 mg by mouth at bedtime.   Yes Historical Provider, MD   Triage Vitals: BP 113/89  Pulse 105  Temp(Src) 99 F (37.2 C) (Oral)  Resp 24  Ht 5\' 8"  (1.727 m)  Wt 224 lb (101.606 kg)  BMI 34.07 kg/m2  SpO2 100%  Vital signs normal except tachycardia  Physical Exam  Nursing note and vitals reviewed. Constitutional: She is oriented to person, place, and time. She appears well-developed and well-nourished.  Non-toxic appearance. She does not appear ill. She appears distressed.  Appears tachypneic.   HENT:  Head: Normocephalic and atraumatic.  Right Ear:  External ear normal.  Left Ear: External ear normal.  Nose: Nose normal. No mucosal edema or rhinorrhea.  Mouth/Throat: Oropharynx is clear and moist and mucous membranes are normal. No dental abscesses or uvula swelling.  Eyes: Conjunctivae and EOM are normal. Pupils are equal, round, and reactive to light.  Neck: Normal range of motion and full passive range of motion without pain. Neck supple.  Cardiovascular: Normal rate, regular rhythm and normal heart sounds.  Exam reveals no gallop and no friction rub.   No murmur heard. Pulmonary/Chest: Tachypnea noted. She is in respiratory distress. She has decreased breath sounds. She has no wheezes. She has no rhonchi. She has no rales. She exhibits no tenderness and no crepitus.  Abdominal: Soft. Normal appearance and bowel sounds are normal. She exhibits no distension. There is no tenderness. There is no rebound and no guarding.  Musculoskeletal: Normal range of motion. She exhibits no edema and no tenderness.  Moves all extremities well.   Neurological: She is alert and oriented to person, place, and time. She has normal strength. No cranial nerve deficit.  Skin: Skin is warm, dry and intact. No rash noted. No erythema. No pallor.  Psychiatric: She has a normal mood and affect. Her speech is normal and behavior is normal. Her mood appears not anxious.    ED Course  Procedures (including critical care time) Medications  albuterol (PROVENTIL HFA;VENTOLIN HFA) 108 (90 BASE) MCG/ACT inhaler 2 puff (not administered)  ipratropium-albuterol (DUONEB) 0.5-2.5 (3) MG/3ML nebulizer solution (3 mLs  Given 06/04/14 2040)  albuterol (PROVENTIL) (2.5 MG/3ML) 0.083% nebulizer solution (2.5 mg  Given 06/04/14 2040)  albuterol (PROVENTIL, VENTOLIN) (5 MG/ML) 0.5% continuous inhalation solution (10 mg  Given 06/04/14 2105)  ipratropium (ATROVENT) 0.02 % nebulizer solution (0.5 mg  Given 06/04/14 2105)  predniSONE (DELTASONE) tablet 60 mg (60 mg Oral Given 06/04/14  2233)   DIAGNOSTIC STUDIES: Oxygen Saturation is 98% on room air, normal by my interpretation.    COORDINATION OF CARE: 9:14 PM-Discussed treatment plan which includes Proventil, Atrovent,and Duoneb with pt at bedside and pt agreed to plan. Patient was starting her continuous nebulizer just prior to my exam.  2210 patient has finished her nebulizer treatment. Her lungs are clear. Patient was ambulated by nursing staff with pulse ox 97%. It did not make her breathing worse. She feels ready to be discharged. We discussed getting a [facility] care [facility]   Imaging Review No results found.   EKG Interpretation None      MDM   Final diagnoses:  Asthma attack   New Prescriptions  PREDNISONE (DELTASONE) 20 MG TABLET    Take 3 po QD x 2d starting tomorrow, then 2 po QD x 3d then 1 po QD x 3d    Plan discharge  Devoria Albe, MD, FACEP   I personally performed the services described in this documentation, which was scribed in my presence. The recorded information has been reviewed and considered.  Devoria Albe, MD, Armando Gang    Ward Givens, MD 06/04/14 [phone]

## 2014-06-04 NOTE — ED Notes (Signed)
Chronic asthma, symptoms increased 4pm today, ran out of meds

## 2014-06-04 NOTE — ED Notes (Signed)
Ambulated Patient around nurses station O2 Sat 97% and the HR was 127

## 2014-06-04 NOTE — Discharge Instructions (Signed)
Drink plenty of fluids. Use the inhaler for wheezing or shortness of breath. Try to get a local doctor. Recheck if you get worse again.  Asthma Attack Prevention Although there is no way to prevent asthma from starting, you can take steps to control the disease and reduce its symptoms. Learn about your asthma and how to control it. Take an active role to control your asthma by working with your health care provider to create and follow an asthma action plan. An asthma action plan guides you in:  Taking your medicines properly.  Avoiding things that set off your asthma or make your asthma worse (asthma triggers).  Tracking your level of asthma control.  Responding to worsening asthma.  Seeking emergency care when needed. To track your asthma, keep records of your symptoms, check your peak flow number using a handheld device that shows how well air moves out of your lungs (peak flow meter), and get regular asthma checkups.  WHAT ARE SOME WAYS TO PREVENT AN ASTHMA ATTACK?  Take medicines as directed by your health care provider.  Keep track of your asthma symptoms and level of control.  With your health care provider, write a detailed plan for taking medicines and managing an asthma attack. Then be sure to follow your action plan. Asthma is an ongoing condition that needs regular monitoring and treatment.  Identify and avoid asthma triggers. Many outdoor allergens and irritants (such as pollen, mold, cold air, and air pollution) can trigger asthma attacks. Find out what your asthma triggers are and take steps to avoid them.  Monitor your breathing. Learn to recognize warning signs of an attack, such as coughing, wheezing, or shortness of breath. Your lung function may decrease before you notice any signs or symptoms, so regularly measure and record your peak airflow with a home peak flow meter.  Identify and treat attacks early. If you act quickly, you are less likely to have a severe attack.  You will also need less medicine to control your symptoms. When your peak flow measurements decrease and alert you to an upcoming attack, take your medicine as instructed and immediately stop any activity that may have triggered the attack. If your symptoms do not improve, get medical help.  Pay attention to increasing quick-relief inhaler use. If you find yourself relying on your quick-relief inhaler, your asthma is not under control. See your health care provider about adjusting your treatment. WHAT CAN MAKE MY SYMPTOMS WORSE? A number of common things can set off or make your asthma symptoms worse and cause temporary increased inflammation of your airways. Keep track of your asthma symptoms for several weeks, detailing all the environmental and emotional factors that are linked with your asthma. When you have an asthma attack, go back to your asthma diary to see which factor, or combination of factors, might have contributed to it. Once you know what these factors are, you can take steps to control many of them. If you have allergies and asthma, it is important to take asthma prevention steps at home. Minimizing contact with the substance to which you are allergic will help prevent an asthma attack. Some triggers and ways to avoid these triggers are: Animal Dander:  Some people are allergic to the flakes of skin or dried saliva from animals with fur or feathers.   There is no such thing as a hypoallergenic dog or cat breed. All dogs or cats can cause allergies, even if they don't shed.  Keep these pets out of  your home.  If you are not able to keep a pet outdoors, keep the pet out of your bedroom and other sleeping areas at all times, and keep the door closed.  Remove carpets and furniture covered with cloth from your home. If that is not possible, keep the pet away from fabric-covered furniture and carpets. Dust Mites: Many people with asthma are allergic to dust mites. Dust mites are tiny bugs  that are found in every home in mattresses, pillows, carpets, fabric-covered furniture, bedcovers, clothes, stuffed toys, and other fabric-covered items.   Cover your mattress in a special dust-proof cover.  Cover your pillow in a special dust-proof cover, or wash the pillow each week in hot water. Water must be hotter than 130 F (54.4 C) to kill dust mites. Cold or warm water used with detergent and bleach can also be effective.  Wash the sheets and blankets on your bed each week in hot water.  Try not to sleep or lie on cloth-covered cushions.  Call ahead when traveling and ask for a smoke-free hotel room. Bring your own bedding and pillows in case the hotel only supplies feather pillows and down comforters, which may contain dust mites and cause asthma symptoms.  Remove carpets from your bedroom and those laid on concrete, if you can.  Keep stuffed toys out of the bed, or wash the toys weekly in hot water or cooler water with detergent and bleach. Cockroaches: Many people with asthma are allergic to the droppings and remains of cockroaches.   Keep food and garbage in closed containers. Never leave food out.  Use poison baits, traps, powders, gels, or paste (for example, boric acid).  If a spray is used to kill cockroaches, stay out of the room until the odor goes away. Indoor Mold:  Fix leaky faucets, pipes, or other sources of water that have mold around them.  Clean floors and moldy surfaces with a fungicide or diluted bleach.  Avoid using humidifiers, vaporizers, or swamp coolers. These can spread molds through the air. Pollen and Outdoor Mold:  When pollen or mold spore counts are high, try to keep your windows closed.  Stay indoors with windows closed from late morning to afternoon. Pollen and some mold spore counts are highest at that time.  Ask your health care provider whether you need to take anti-inflammatory medicine or increase your dose of the medicine before  your allergy season starts. Other Irritants to Avoid:  Tobacco smoke is an irritant. If you smoke, ask your health care provider how you can quit. Ask family members to quit smoking, too. Do not allow smoking in your home or car.  If possible, do not use a wood-burning stove, kerosene heater, or fireplace. Minimize exposure to all sources of smoke, including incense, candles, fires, and fireworks.  Try to stay away from strong odors and sprays, such as perfume, talcum powder, hair spray, and paints.  Decrease humidity in your home and use an indoor air cleaning device. Reduce indoor humidity to below 60%. Dehumidifiers or central air conditioners can do this.  Decrease house dust exposure by changing furnace and air cooler filters frequently.  Try to have someone else vacuum for you once or twice a week. Stay out of rooms while they are being vacuumed and for a short while afterward.  If you vacuum, use a dust mask from a hardware store, a double-layered or microfilter vacuum cleaner bag, or a vacuum cleaner with a HEPA filter.  Sulfites in foods  and beverages can be irritants. Do not drink beer or wine or eat dried fruit, processed potatoes, or shrimp if they cause asthma symptoms.  Cold air can trigger an asthma attack. Cover your nose and mouth with a scarf on cold or windy days.  Several health conditions can make asthma more difficult to manage, including a runny nose, sinus infections, reflux disease, psychological stress, and sleep apnea. Work with your health care provider to manage these conditions.  Avoid close contact with people who have a respiratory infection such as a cold or the flu, since your asthma symptoms may get worse if you catch the infection. Wash your hands thoroughly after touching items that may have been handled by people with a respiratory infection.  Get a flu shot every year to protect against the flu virus, which often makes asthma worse for days or weeks.  Also get a pneumonia shot if you have not previously had one. Unlike the flu shot, the pneumonia shot does not need to be given yearly. Medicines:  Talk to your health care provider about whether it is safe for you to take aspirin or non-steroidal anti-inflammatory medicines (NSAIDs). In a small number of people with asthma, aspirin and NSAIDs can cause asthma attacks. These medicines must be avoided by people who have known aspirin-sensitive asthma. It is important that people with aspirin-sensitive asthma read labels of all over-the-counter medicines used to treat pain, colds, coughs, and fever.  Beta-blockers and ACE inhibitors are other medicines you should discuss with your health care provider. HOW CAN I FIND OUT WHAT I AM ALLERGIC TO? Ask your asthma health care provider about allergy skin testing or blood testing (the RAST test) to identify the allergens to which you are sensitive. If you are found to have allergies, the most important thing to do is to try to avoid exposure to any allergens that you are sensitive to as much as possible. Other treatments for allergies, such as medicines and allergy shots (immunotherapy) are available.  CAN I EXERCISE? Follow your health care provider's advice regarding asthma treatment before exercising. It is important to maintain a regular exercise program, but vigorous exercise or exercise in cold, humid, or dry environments can cause asthma attacks, especially for those people who have exercise-induced asthma. Document Released: 11/17/2009 Document Revised: 12/04/2013 Document Reviewed: 06/06/2013 [facility] Patient Information 2015 [employer], [place]. This information is not intended to replace advice given to you by your health care provider. Make sure you discuss any questions you have with your health care provider.

## 2014-07-21 IMAGING — CR DG CHEST 2V
2 series · 2 of 2 positions shown · non-contrast
Comparison: DG CHEST 2 VIEW dated 02/21/2014

CLINICAL DATA: WHEEZING

EXAM:
CHEST  2 VIEW

[view not recorded (1 of 2)]
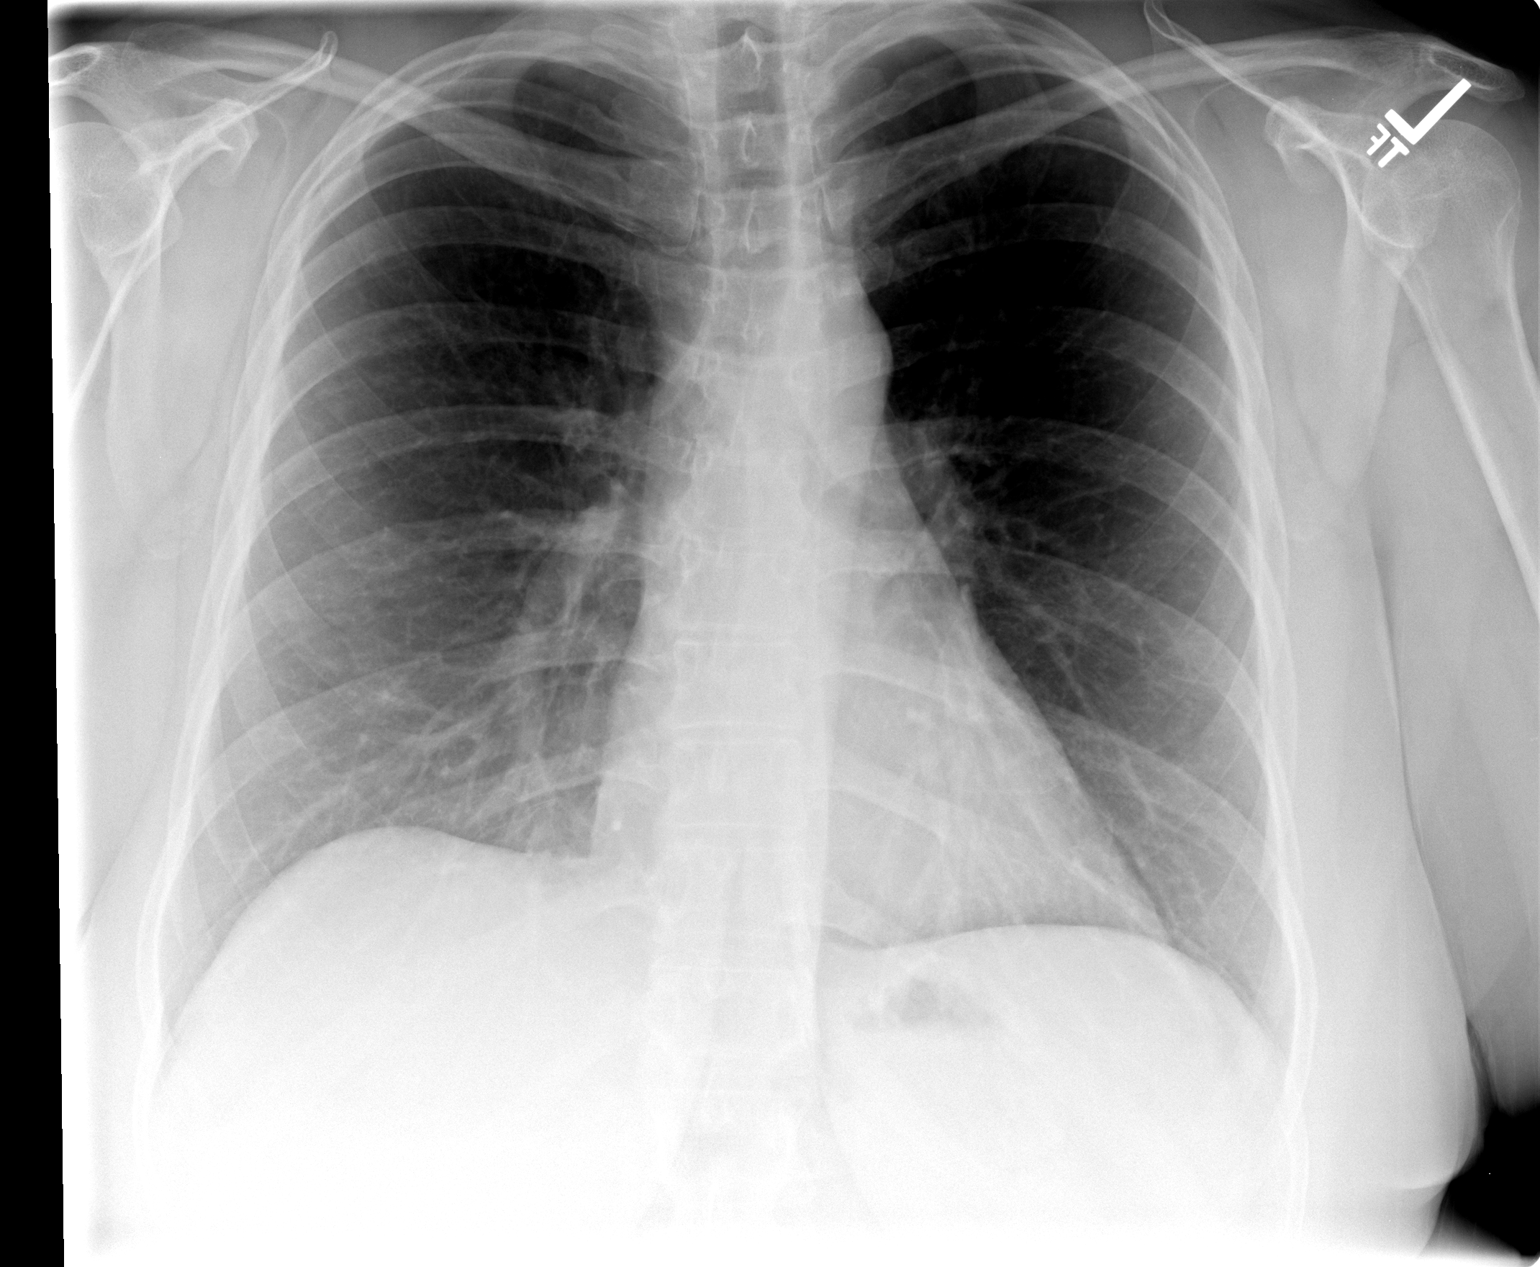

[view not recorded (2 of 2)]
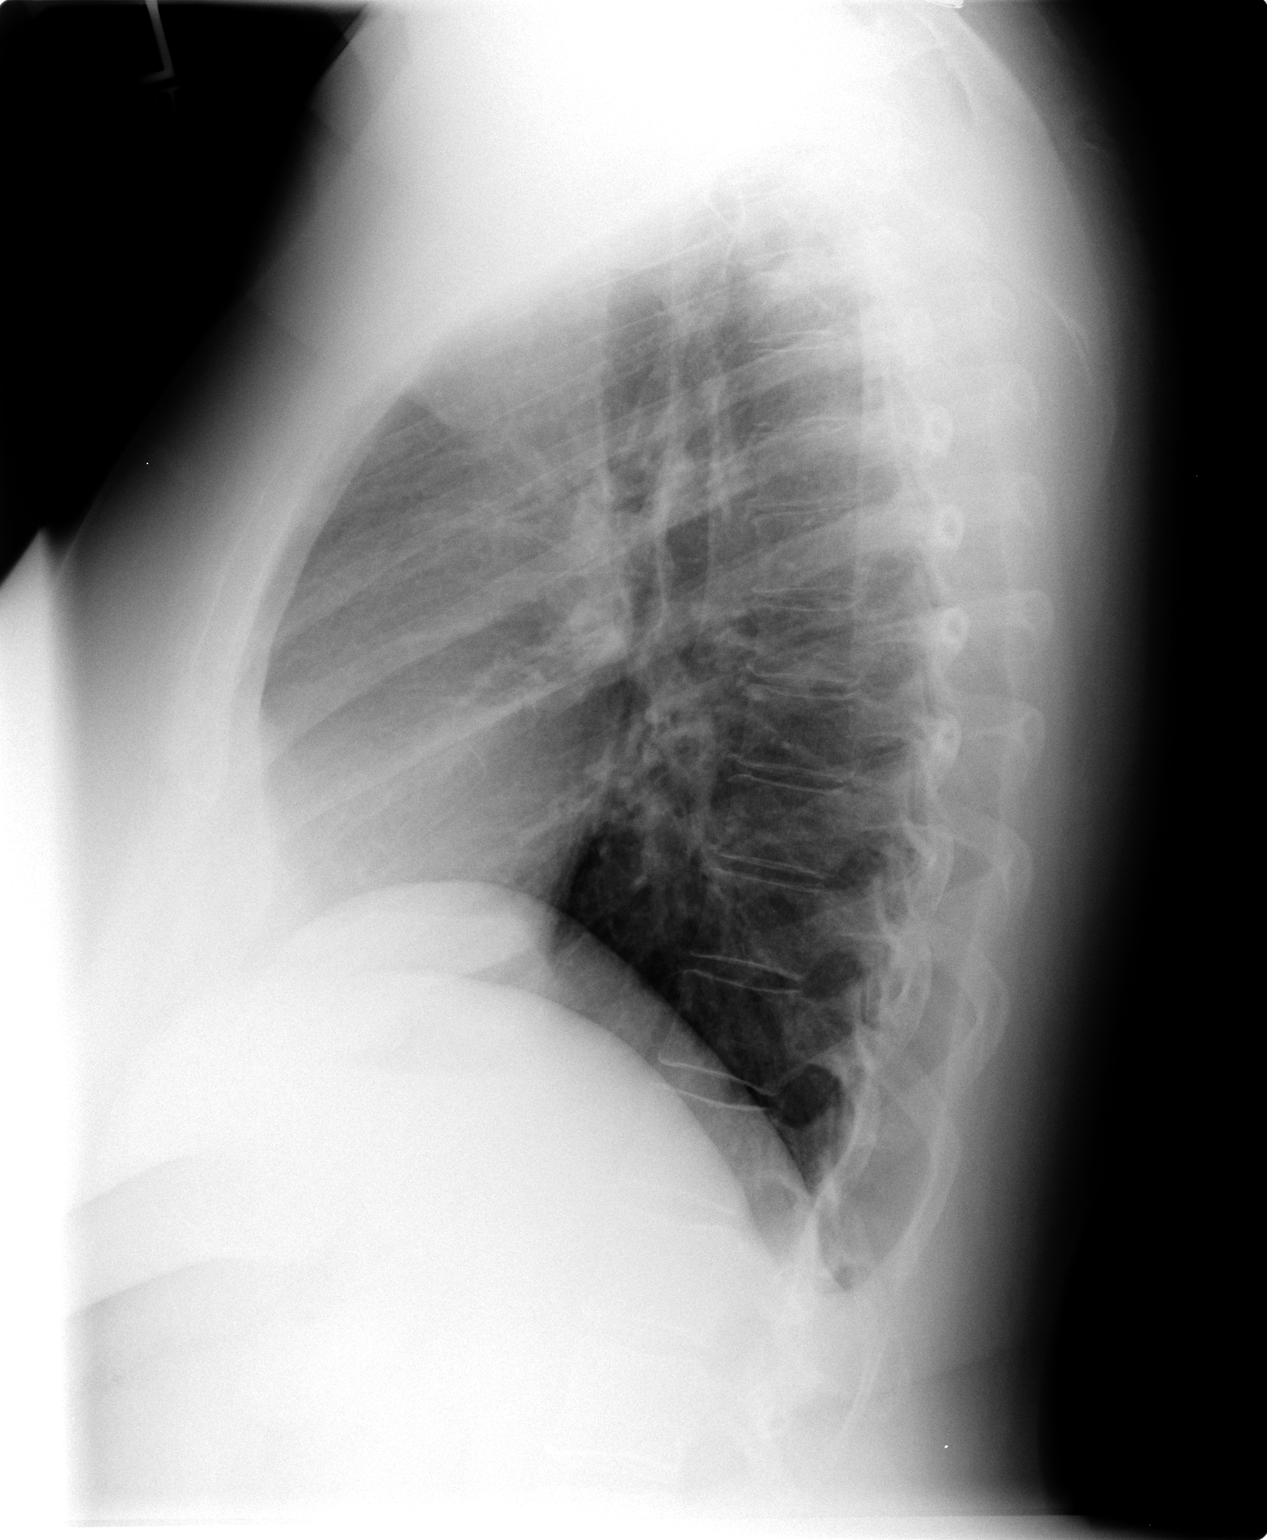

[2 of 2 positions shown; findings below may reference images not displayed]

FINDINGS: The heart size and mediastinal contours are within normal limits.
Both lungs are clear. The visualized skeletal structures are
unremarkable.
IMPRESSION: No active cardiopulmonary disease.

## 2014-08-24 ENCOUNTER — Emergency Department (HOSPITAL_COMMUNITY)
Admission: EM | Admit: 2014-08-24 | Discharge: 2014-08-24 | Disposition: A | Discharge status: Home / Self Care | Payer: Self-pay | Attending: Emergency Medicine | Admitting: Emergency Medicine

## 2014-08-24 ENCOUNTER — Encounter (HOSPITAL_COMMUNITY): Payer: Self-pay | Admitting: Emergency Medicine

## 2014-08-24 DIAGNOSIS — IMO0002 Reserved for concepts with insufficient information to code with codable children: Secondary | ICD-10-CM | POA: Insufficient documentation

## 2014-08-24 DIAGNOSIS — R062 Wheezing: Secondary | ICD-10-CM | POA: Insufficient documentation

## 2014-08-24 DIAGNOSIS — Z8719 Personal history of other diseases of the digestive system: Secondary | ICD-10-CM | POA: Insufficient documentation

## 2014-08-24 DIAGNOSIS — J4520 Mild intermittent asthma, uncomplicated: Secondary | ICD-10-CM

## 2014-08-24 DIAGNOSIS — J45901 Unspecified asthma with (acute) exacerbation: Secondary | ICD-10-CM | POA: Insufficient documentation

## 2014-08-24 DIAGNOSIS — Z862 Personal history of diseases of the blood and blood-forming organs and certain disorders involving the immune mechanism: Secondary | ICD-10-CM | POA: Insufficient documentation

## 2014-08-24 DIAGNOSIS — Z79899 Other long term (current) drug therapy: Secondary | ICD-10-CM | POA: Insufficient documentation

## 2014-08-24 DIAGNOSIS — M129 Arthropathy, unspecified: Secondary | ICD-10-CM | POA: Insufficient documentation

## 2014-08-24 MED ORDER — ALBUTEROL SULFATE HFA 108 (90 BASE) MCG/ACT IN AERS: 1.0000 | INHALATION_SPRAY | Freq: Four times a day (QID) | RESPIRATORY_TRACT | Status: DC | PRN

## 2014-08-24 MED ORDER — ALBUTEROL SULFATE HFA 108 (90 BASE) MCG/ACT IN AERS
2.0000 | INHALATION_SPRAY | RESPIRATORY_TRACT | Status: DC | PRN
  Administered 2014-08-24: 2 via RESPIRATORY_TRACT
  Filled 2014-08-24: qty 6.7

## 2014-08-24 MED ORDER — IPRATROPIUM-ALBUTEROL 0.5-2.5 (3) MG/3ML IN SOLN
3.0000 mL | Freq: Once | RESPIRATORY_TRACT | Status: AC
  Administered 2014-08-24: 3 mL via RESPIRATORY_TRACT
  Filled 2014-08-24: qty 3

## 2014-08-24 MED ORDER — ALBUTEROL SULFATE (2.5 MG/3ML) 0.083% IN NEBU
2.5000 mg | INHALATION_SOLUTION | Freq: Once | RESPIRATORY_TRACT | Status: AC
  Administered 2014-08-24: 2.5 mg via RESPIRATORY_TRACT
  Filled 2014-08-24: qty 3

## 2014-08-24 NOTE — Discharge Instructions (Signed)
Follow up as needed

## 2014-08-24 NOTE — ED Notes (Signed)
resp paged

## 2014-08-24 NOTE — ED Notes (Signed)
Pt c/o wheezing. States her inhaler ran out last night. Audible wheezing at triage. No distress.

## 2014-08-24 NOTE — ED Provider Notes (Signed)
CSN: [id number]     Arrival date & time 08/24/14  2041 History   First MD Initiated Contact with Patient 08/24/14 2047    This chart was scribed for Benny Lennert, MD by Marica Otter, ED Scribe. This patient was seen in room [facility]18[facility]18 and the patient's care was started at 9:00 PM.  Chief Complaint  Patient presents with  . Wheezing   Patient is a 43 y.o. female presenting with wheezing. The history is provided by the patient. No language interpreter was used.  Wheezing Severity:  Moderate Onset quality:  Sudden Duration:  3 hours Timing:  Constant Progression:  Unchanged Chronicity:  Recurrent Context comment:  Ran out of inhaler last night Relieved by:  None tried Worsened by:  Nothing tried Ineffective treatments:  None tried Associated symptoms: no chest pain, no cough, no fatigue, no fever, no headaches and no rash    HPI Comments: Bailey Mcdonald is a 43 y.o. female, with medical Hx noted below and significant for asthma, who presents to the Emergency Department complaining of wheezing onset this evening around 6:30PM. Pt reports that she ran out of her inhaler last night. Pt reports taking Singulair in the past with good results. However, pt discontinued taking Singulair when her health insurance lapsed.   Past Medical History  Diagnosis Date  . Asthma   . Arthritis   . IBS (irritable bowel syndrome)   . Hypoglycemia   . Anemia    Past Surgical History  Procedure Laterality Date  . Mouth surgery      removal of teeth  . Tubal ligation    . Knee surgery  05/2011    rtight knee scope chondroplasty patella  . Abdominal hysterectomy    . Knee arthroscopy  09/24/2011    Procedure: ARTHROSCOPY KNEE;  Surgeon: [name], MD;  Location: [facility];  Service: Orthopedics;  Laterality: Left;  . Chondroplasty  09/24/2011    Procedure: CHONDROPLASTY;  Surgeon: [name], MD;  Location: [facility];  Service: Orthopedics;  Laterality: Left;  Chondroplasty Patella    Family History  Problem Relation Age of Onset  . Heart disease    . Arthritis    . Cancer    . Asthma    . Diabetes    . Anesthesia problems Neg Hx   . Hypotension Neg Hx   . Malignant hyperthermia Neg Hx   . Pseudochol deficiency Neg Hx    History  Substance Use Topics  . Smoking status: Never Smoker   . Smokeless tobacco: Not on file  . Alcohol Use: No   OB History   Grav Para Term Preterm Abortions TAB SAB Ect Mult Living                 Review of Systems  Constitutional: Negative for fever, chills, appetite change and fatigue.  HENT: Negative for congestion, ear discharge and sinus pressure.   Eyes: Negative for discharge.  Respiratory: Positive for wheezing. Negative for cough.   Cardiovascular: Negative for chest pain.  Gastrointestinal: Negative for abdominal pain and diarrhea.  Genitourinary: Negative for frequency and hematuria.  Musculoskeletal: Negative for back pain.  Skin: Negative for rash.  Neurological: Negative for seizures and headaches.  Psychiatric/Behavioral: Negative for hallucinations and confusion.   Allergies  Review of patient's allergies indicates no known allergies.  Home Medications   Prior to Admission medications   Medication Sig Start Date End Date Taking? Authorizing Provider  albuterol (PROAIR HFA) 108 (90 BASE) MCG/ACT inhaler Inhale  2 puffs into the lungs every 6 (six) hours as needed for wheezing or shortness of breath.    Historical Provider, MD  Melatonin 3 MG TABS Take 3 mg by mouth at bedtime.    Historical Provider, MD  predniSONE (DELTASONE) 20 MG tablet Take 3 po QD x 2d starting tomorrow, then 2 po QD x 3d then 1 po QD x 3d 06/04/14   Ward Givens, MD   Triage Vitals: BP 128/93  Pulse 86  Temp(Src) 97.9 F (36.6 C) (Oral)  Resp 26  Ht  (1.727 m)  Wt 220 lb (99.791 kg)  BMI 33.46 kg/m2  SpO2 100% Physical Exam  Constitutional: She is oriented to person, place, and time. She appears well-developed.  HENT:   Head: Normocephalic.  Eyes: Conjunctivae and EOM are normal. No scleral icterus.  Neck: Neck supple. No thyromegaly present.  Cardiovascular: Normal rate and regular rhythm.  Exam reveals no gallop and no friction rub.   No murmur heard. Pulmonary/Chest: No stridor. She has wheezes (wheezing bilatearlly). She has no rales. She exhibits no tenderness.  Abdominal: She exhibits no distension. There is no tenderness. There is no rebound.  Musculoskeletal: Normal range of motion. She exhibits no edema.  Lymphadenopathy:    She has no cervical adenopathy.  Neurological: She is oriented to person, place, and time. She exhibits normal muscle tone. Coordination normal.  Skin: No rash noted. No erythema.  Psychiatric: She has a normal mood and affect. Her behavior is normal.    ED Course  Procedures (including critical care time) DIAGNOSTIC STUDIES: Oxygen Saturation is 100% on RA, nl by my interpretation.    COORDINATION OF CARE: 9:04 PM-Discussed treatment plan which includes breathing treatment and prednisone with pt at bedside and pt agreed to plan.   Labs Review Labs Reviewed - No data to display  Imaging Review No results found.   EKG Interpretation None      MDM   Final diagnoses:  None    Asthma.  Pt improved with tx  The chart was scribed for me under my direct supervision.  I personally performed the history, physical, and medical decision making and all procedures in the evaluation of this patient.Benny Lennert, MD 08/24/14 2218

## 2014-08-24 NOTE — Progress Notes (Addendum)
PT usually takes singulair which controls her asthma, now she has no insurance and cannot afford the 175 dollar payment a month for singulair; normal price was 6 dollars for her.

## 2014-09-07 ENCOUNTER — Emergency Department (HOSPITAL_COMMUNITY)
Admission: EM | Admit: 2014-09-07 | Discharge: 2014-09-07 | Disposition: A | Discharge status: Home / Self Care | Payer: Self-pay | Attending: Emergency Medicine | Admitting: Emergency Medicine

## 2014-09-07 ENCOUNTER — Encounter (HOSPITAL_COMMUNITY): Payer: Self-pay | Admitting: Emergency Medicine

## 2014-09-07 ENCOUNTER — Emergency Department (HOSPITAL_COMMUNITY): Payer: Self-pay

## 2014-09-07 DIAGNOSIS — J45909 Unspecified asthma, uncomplicated: Secondary | ICD-10-CM | POA: Insufficient documentation

## 2014-09-07 DIAGNOSIS — J3489 Other specified disorders of nose and nasal sinuses: Secondary | ICD-10-CM | POA: Insufficient documentation

## 2014-09-07 DIAGNOSIS — Z8739 Personal history of other diseases of the musculoskeletal system and connective tissue: Secondary | ICD-10-CM | POA: Insufficient documentation

## 2014-09-07 DIAGNOSIS — J45901 Unspecified asthma with (acute) exacerbation: Secondary | ICD-10-CM | POA: Insufficient documentation

## 2014-09-07 DIAGNOSIS — Z8719 Personal history of other diseases of the digestive system: Secondary | ICD-10-CM | POA: Insufficient documentation

## 2014-09-07 DIAGNOSIS — J029 Acute pharyngitis, unspecified: Secondary | ICD-10-CM | POA: Insufficient documentation

## 2014-09-07 DIAGNOSIS — Z8639 Personal history of other endocrine, nutritional and metabolic disease: Secondary | ICD-10-CM | POA: Insufficient documentation

## 2014-09-07 DIAGNOSIS — Z862 Personal history of diseases of the blood and blood-forming organs and certain disorders involving the immune mechanism: Secondary | ICD-10-CM | POA: Insufficient documentation

## 2014-09-07 DIAGNOSIS — Z79899 Other long term (current) drug therapy: Secondary | ICD-10-CM | POA: Insufficient documentation

## 2014-09-07 MED ORDER — ALBUTEROL SULFATE (2.5 MG/3ML) 0.083% IN NEBU
2.5000 mg | INHALATION_SOLUTION | Freq: Once | RESPIRATORY_TRACT | Status: AC
  Administered 2014-09-07: 2.5 mg via RESPIRATORY_TRACT
  Filled 2014-09-07: qty 3

## 2014-09-07 MED ORDER — GUAIFENESIN-CODEINE 100-10 MG/5ML PO SYRP: 10.0000 mL | ORAL_SOLUTION | Freq: Three times a day (TID) | ORAL | Status: DC | PRN

## 2014-09-07 MED ORDER — IPRATROPIUM-ALBUTEROL 0.5-2.5 (3) MG/3ML IN SOLN
3.0000 mL | Freq: Once | RESPIRATORY_TRACT | Status: AC
  Administered 2014-09-07: 3 mL via RESPIRATORY_TRACT
  Filled 2014-09-07: qty 3

## 2014-09-07 MED ORDER — ALBUTEROL SULFATE (2.5 MG/3ML) 0.083% IN NEBU: 5.0000 mg | INHALATION_SOLUTION | Freq: Once | RESPIRATORY_TRACT | Status: DC

## 2014-09-07 MED ORDER — PREDNISONE 10 MG PO TABS: ORAL_TABLET | ORAL | Status: DC

## 2014-09-07 MED ORDER — PREDNISONE 50 MG PO TABS
60.0000 mg | ORAL_TABLET | Freq: Once | ORAL | Status: AC
  Administered 2014-09-07: 60 mg via ORAL
  Filled 2014-09-07 (×2): qty 1

## 2014-09-07 NOTE — ED Notes (Signed)
Pt states she has had a cold with a cough for the past couple days. This made her asthma worse.  She has an inhaler but it is not working.

## 2014-09-07 NOTE — ED Notes (Signed)
Patient with no complaints at this time. Respirations even and unlabored. Skin warm/dry. Discharge instructions reviewed with patient at this time. Patient given opportunity to voice concerns/ask questions. Patient discharged at this time and left Emergency Department with steady gait.   

## 2014-09-07 NOTE — Progress Notes (Addendum)
Patient came in ER 2 weeks past, stated she started having more trouble with her asthma , because she could not afford her 75 dollar singulair . Checked and found out she could get Rx from [facility] for 25 maybe less. ??? See past ER note.

## 2014-09-07 NOTE — Discharge Instructions (Signed)

## 2014-09-09 NOTE — ED Provider Notes (Signed)
CSN: [id number]     Arrival date & time 09/07/14  1805 History   First MD Initiated Contact with Patient 09/07/14 1829     Chief Complaint  Patient presents with  . Asthma     (Consider location/radiation/quality/duration/timing/severity/associated sxs/prior Treatment)  Bailey Mcdonald is a 43 y.o. female who presents to the Emergency Department complaining of cough, wheezing and difficulty breathing for several days.  She states that she has a "cold" and reports "flare ups" of her asthma when she gets sick.  She c/o cough, wheezing, nasal congestion and mild sore throat.  She has been using her inhaler without relief.   Patient is a 43 y.o. female presenting with asthma.  Asthma This is a recurrent problem. The current episode started in the past 7 days. The problem occurs constantly. The problem has been gradually worsening. Associated symptoms include congestion, coughing and a sore throat. Pertinent negatives include no abdominal pain, arthralgias, chills, fever, headaches, nausea, neck pain, numbness, swollen glands, urinary symptoms, visual change, vomiting or weakness. Nothing aggravates the symptoms. Treatments tried: over the counter cold and cough medications. The treatment provided no relief.    Past Medical History  Diagnosis Date  . Asthma   . Arthritis   . IBS (irritable bowel syndrome)   . Hypoglycemia   . Anemia    Past Surgical History  Procedure Laterality Date  . Mouth surgery      removal of teeth  . Tubal ligation    . Knee surgery  05/2011    rtight knee scope chondroplasty patella  . Abdominal hysterectomy    . Knee arthroscopy  09/24/2011    Procedure: ARTHROSCOPY KNEE;  Surgeon: [name], MD;  Location: [facility];  Service: Orthopedics;  Laterality: Left;  . Chondroplasty  09/24/2011    Procedure: CHONDROPLASTY;  Surgeon: [name], MD;  Location: [facility];  Service: Orthopedics;  Laterality: Left;  Chondroplasty Patella   Family History   Problem Relation Age of Onset  . Heart disease    . Arthritis    . Cancer    . Asthma    . Diabetes    . Anesthesia problems Neg Hx   . Hypotension Neg Hx   . Malignant hyperthermia Neg Hx   . Pseudochol deficiency Neg Hx    History  Substance Use Topics  . Smoking status: Never Smoker   . Smokeless tobacco: Not on file  . Alcohol Use: No   OB History   Grav Para Term Preterm Abortions TAB SAB Ect Mult Living                 Review of Systems  Constitutional: Negative for fever, chills, activity change and appetite change.  HENT: Positive for congestion, rhinorrhea and sore throat. Negative for ear pain, facial swelling and trouble swallowing.   Eyes: Negative for visual disturbance.  Respiratory: Positive for cough, chest tightness, shortness of breath and wheezing. Negative for stridor.   Gastrointestinal: Negative for nausea, vomiting and abdominal pain.  Genitourinary: Negative for dysuria.  Musculoskeletal: Negative for arthralgias, neck pain and neck stiffness.  Skin: Negative.   Neurological: Negative for dizziness, weakness, numbness and headaches.  Hematological: Negative for adenopathy.  Psychiatric/Behavioral: Negative for confusion.  All other systems reviewed and are negative.     Allergies  Review of patient's allergies indicates no known allergies.  Home Medications   Prior to Admission medications   Medication Sig Start Date End Date Taking? Authorizing Provider  albuterol (PROVENTIL HFA;VENTOLIN  HFA) 108 (90 BASE) MCG/ACT inhaler Inhale 1-2 puffs into the lungs every 6 (six) hours as needed for wheezing or shortness of breath. 08/24/14   Benny Lennert, MD  guaiFENesin-codeine (ROBITUSSIN AC) 100-10 MG/5ML syrup Take 10 mLs by mouth 3 (three) times daily as needed. 09/07/14   Zeynab Klett L. Joshawn Crissman, PA-C  predniSONE (DELTASONE) 10 MG tablet Take 6 tablets day one, 5 tablets day two, 4 tablets day three, 3 tablets day four, 2 tablets day five, then 1 tablet  day six 09/07/14   Yahir Tavano L. Tezra Mahr, PA-C   BP 129/90  Pulse 103  Temp(Src) 100 F (37.8 C) (Oral)  Resp 20  Ht  (1.727 m)  Wt 215 lb (97.523 kg)  BMI 32.70 kg/m2  SpO2 98% Physical Exam  Nursing note and vitals reviewed. Constitutional: She is oriented to person, place, and time. She appears well-developed and well-nourished. No distress.  HENT:  Head: Normocephalic and atraumatic.  Right Ear: Tympanic membrane and ear canal normal.  Left Ear: Tympanic membrane and ear canal normal.  Nose: Mucosal edema and rhinorrhea present.  Mouth/Throat: Uvula is midline and mucous membranes are normal. Posterior oropharyngeal erythema present. No oropharyngeal exudate, posterior oropharyngeal edema or tonsillar abscesses.  Eyes: EOM are normal. Pupils are equal, round, and reactive to light.  Neck: Normal range of motion, full passive range of motion without pain and phonation normal. Neck supple.  Cardiovascular: Normal rate, regular rhythm, normal heart sounds and intact distal pulses.   No murmur heard. Pulmonary/Chest: Effort normal. No stridor. No respiratory distress. She has wheezes. She has no rales. She exhibits no tenderness.  Mildly diminished breath sounds bilaterally with inspiratory and expiratory wheezing bilaterally.  No rales  Abdominal: Soft. She exhibits no distension. There is no tenderness.  Musculoskeletal: She exhibits no edema.  Lymphadenopathy:    She has no cervical adenopathy.  Neurological: She is alert and oriented to person, place, and time. She exhibits normal muscle tone. Coordination normal.  Skin: Skin is warm and dry.    ED Course  Procedures (including critical care time) Labs Review Labs Reviewed - No data to display  Imaging Review Dg Chest 2 View  09/07/2014   CLINICAL DATA:  Shortness of breath, cough, fever  EXAM: CHEST  2 VIEW  COMPARISON:  03/18/2014  FINDINGS: The heart size and mediastinal contours are within normal limits. Both lungs  are clear. The visualized skeletal structures are unremarkable.  IMPRESSION: No active cardiopulmonary disease.   Electronically Signed   By: Elige Ko   On: 09/07/2014 18:53     EKG Interpretation None      MDM   Final diagnoses:  Asthma exacerbation    Pt is non-toxic appearing.  No respiratory distress noted, no hypoxia.  Sx's greatly improved after albuterol/atrovent neb and po prednisone, wheezing resolved.  Pt feeling better and appears stable for d/c.  rx for robitussin AC and prednisone taper.  She agrees to return here if needed.     Maureen Duesing L. Trisha Mangle, PA-C 09/09/14 1232

## 2014-09-10 NOTE — ED Provider Notes (Signed)
Medical screening examination/treatment/procedure(s) were performed by non-physician practitioner and as supervising physician I was immediately available for consultation/collaboration.   EKG Interpretation None       Tamanika Heiney, MD 09/10/14 2300 

## 2014-12-22 ENCOUNTER — Encounter (HOSPITAL_COMMUNITY): Payer: Self-pay | Admitting: Emergency Medicine

## 2014-12-22 ENCOUNTER — Emergency Department (HOSPITAL_COMMUNITY)
Admission: EM | Admit: 2014-12-22 | Discharge: 2014-12-22 | Discharge status: Against Medical Advice | Payer: Self-pay | Attending: Emergency Medicine | Admitting: Emergency Medicine

## 2014-12-22 ENCOUNTER — Emergency Department (HOSPITAL_COMMUNITY): Payer: Self-pay

## 2014-12-22 DIAGNOSIS — R0602 Shortness of breath: Secondary | ICD-10-CM | POA: Insufficient documentation

## 2014-12-22 DIAGNOSIS — J45909 Unspecified asthma, uncomplicated: Secondary | ICD-10-CM | POA: Insufficient documentation

## 2014-12-22 NOTE — ED Notes (Signed)
Patient reports history of asthma. States she ran of out her inhaler today. Complaining of slight shortness of breath and wheezing.

## 2014-12-22 NOTE — ED Notes (Signed)
Patient called to be placed in room, no answer.

## 2014-12-22 NOTE — ED Notes (Signed)
Registration stated patient left. 

## 2015-03-13 ENCOUNTER — Emergency Department (HOSPITAL_COMMUNITY)
Admission: EM | Admit: 2015-03-13 | Discharge: 2015-03-13 | Disposition: A | Discharge status: Home / Self Care | Payer: Self-pay | Attending: Emergency Medicine | Admitting: Emergency Medicine

## 2015-03-13 ENCOUNTER — Encounter (HOSPITAL_COMMUNITY): Payer: Self-pay | Admitting: Emergency Medicine

## 2015-03-13 DIAGNOSIS — M1990 Unspecified osteoarthritis, unspecified site: Secondary | ICD-10-CM | POA: Insufficient documentation

## 2015-03-13 DIAGNOSIS — Z79899 Other long term (current) drug therapy: Secondary | ICD-10-CM | POA: Insufficient documentation

## 2015-03-13 DIAGNOSIS — Z8639 Personal history of other endocrine, nutritional and metabolic disease: Secondary | ICD-10-CM | POA: Insufficient documentation

## 2015-03-13 DIAGNOSIS — Z7952 Long term (current) use of systemic steroids: Secondary | ICD-10-CM | POA: Insufficient documentation

## 2015-03-13 DIAGNOSIS — Z862 Personal history of diseases of the blood and blood-forming organs and certain disorders involving the immune mechanism: Secondary | ICD-10-CM | POA: Insufficient documentation

## 2015-03-13 DIAGNOSIS — J45901 Unspecified asthma with (acute) exacerbation: Secondary | ICD-10-CM | POA: Insufficient documentation

## 2015-03-13 DIAGNOSIS — Z8719 Personal history of other diseases of the digestive system: Secondary | ICD-10-CM | POA: Insufficient documentation

## 2015-03-13 MED ORDER — ALBUTEROL SULFATE HFA 108 (90 BASE) MCG/ACT IN AERS
2.0000 | INHALATION_SPRAY | RESPIRATORY_TRACT | Status: DC | PRN
  Administered 2015-03-13: 2 via RESPIRATORY_TRACT
  Filled 2015-03-13: qty 6.7

## 2015-03-13 MED ORDER — PREDNISONE 50 MG PO TABS
60.0000 mg | ORAL_TABLET | Freq: Once | ORAL | Status: AC
  Administered 2015-03-13: 60 mg via ORAL
  Filled 2015-03-13 (×2): qty 1

## 2015-03-13 MED ORDER — IPRATROPIUM-ALBUTEROL 0.5-2.5 (3) MG/3ML IN SOLN
3.0000 mL | Freq: Once | RESPIRATORY_TRACT | Status: AC
  Administered 2015-03-13: 3 mL via RESPIRATORY_TRACT
  Filled 2015-03-13: qty 3

## 2015-03-13 MED ORDER — ALBUTEROL SULFATE (2.5 MG/3ML) 0.083% IN NEBU
2.5000 mg | INHALATION_SOLUTION | Freq: Once | RESPIRATORY_TRACT | Status: AC
  Administered 2015-03-13: 2.5 mg via RESPIRATORY_TRACT
  Filled 2015-03-13: qty 3

## 2015-03-13 NOTE — Discharge Instructions (Signed)
Follow up as needed

## 2015-03-13 NOTE — ED Notes (Signed)
Patient ran out of her inhaler and ran out this morning; patient presents to ED with wheezing.

## 2015-03-13 NOTE — ED Provider Notes (Signed)
CSN: [id number]     Arrival date & time 03/13/15  2129 History  This chart was scribed for Bailey Berkshire, MD by Bronson Curb, ED Scribe. This patient was seen in room  and the patient's care was started at 9:50 PM.   Chief Complaint  Patient presents with  . Asthma    Patient is a 44 y.o. female presenting with asthma. The history is provided by the patient. No language interpreter was used.  Asthma This is a chronic problem. Episode onset: PTA. The problem occurs constantly. The problem has not changed since onset.Associated symptoms include shortness of breath. Pertinent negatives include no chest pain, no abdominal pain and no headaches. Nothing aggravates the symptoms. Relieved by: Albuterol inhaler. She has tried nothing for the symptoms.     HPI Comments: Bevely Mcdonald is a 44 y.o. female who presents to the Emergency Department complaining of exacerbation of asthma with associated wheezing that began PTA. Patient has history of asthma and uses an albuterol inhaler 2-3 times daily. However, she states she ran out this morning. Patient has not yet received a breathing treatment since her arrival. No aggravating or alleviating factors noted. She is not on any other medications at this time. She denies any other symptoms.   Past Medical History  Diagnosis Date  . Asthma   . Arthritis   . IBS (irritable bowel syndrome)   . Hypoglycemia   . Anemia    Past Surgical History  Procedure Laterality Date  . Mouth surgery      removal of teeth  . Tubal ligation    . Knee surgery  05/2011    rtight knee scope chondroplasty patella  . Abdominal hysterectomy    . Knee arthroscopy  09/24/2011    Procedure: ARTHROSCOPY KNEE;  Surgeon: [name], MD;  Location: [facility];  Service: Orthopedics;  Laterality: Left;  . Chondroplasty  09/24/2011    Procedure: CHONDROPLASTY;  Surgeon: [name], MD;  Location: [facility];  Service: Orthopedics;  Laterality: Left;  Chondroplasty Patella    Family History  Problem Relation Age of Onset  . Heart disease    . Arthritis    . Cancer    . Asthma    . Diabetes    . Anesthesia problems Neg Hx   . Hypotension Neg Hx   . Malignant hyperthermia Neg Hx   . Pseudochol deficiency Neg Hx    History  Substance Use Topics  . Smoking status: Never Smoker   . Smokeless tobacco: Not on file  . Alcohol Use: No   OB History    No data available     Review of Systems  Constitutional: Negative for appetite change and fatigue.  HENT: Negative for congestion, ear discharge and sinus pressure.   Eyes: Negative for discharge.  Respiratory: Positive for shortness of breath and wheezing. Negative for cough.   Cardiovascular: Negative for chest pain.  Gastrointestinal: Negative for abdominal pain and diarrhea.  Genitourinary: Negative for frequency and hematuria.  Musculoskeletal: Negative for back pain.  Skin: Negative for rash.  Neurological: Negative for seizures and headaches.  Psychiatric/Behavioral: Negative for hallucinations.      Allergies  Review of patient's allergies indicates no known allergies.  Home Medications   Prior to Admission medications   Medication Sig Start Date End Date Taking? Authorizing Provider  albuterol (PROVENTIL HFA;VENTOLIN HFA) 108 (90 BASE) MCG/ACT inhaler Inhale 1-2 puffs into the lungs every 6 (six) hours as needed for wheezing or shortness of breath.  08/24/14   Bailey BerkshireJoseph Sanaai Doane, MD  guaiFENesin-codeine [facility](ROBITUSSIN AC) 100-10 MG/5ML syrup Take 10 mLs by mouth 3 (three) times daily as needed. 09/07/14   Bailey Triplett, PA-C  predniSONE (DELTASONE) 10 MG tablet Take 6 tablets day one, 5 tablets day two, 4 tablets day three, 3 tablets day four, 2 tablets day five, then 1 tablet day six 09/07/14   Bailey Triplett, PA-C   Triage Vitals: BP 132/68 mmHg  Pulse 88  Temp(Src) 97.1 F (36.2 C) (Oral)  Resp 20  Ht 5\' 8"  (1.727 m)  Wt 210 lb (95.255 kg)  BMI 31.94 kg/m2  SpO2 100%  Physical Exam   Constitutional: She is oriented to person, place, and time. She appears well-developed.  HENT:  Head: Normocephalic.  Eyes: Conjunctivae and EOM are normal. No scleral icterus.  Neck: Neck supple. No thyromegaly present.  Cardiovascular: Normal rate, regular rhythm and normal heart sounds.  Exam reveals no gallop and no friction rub.   No murmur heard. Pulmonary/Chest: No stridor. She has wheezes. She has no rales. She exhibits no tenderness.  Mild wheezes bilaterally.  Abdominal: She exhibits no distension. There is no tenderness. There is no rebound.  Musculoskeletal: Normal range of motion. She exhibits no edema.  Lymphadenopathy:    She has no cervical adenopathy.  Neurological: She is oriented to person, place, and time. She exhibits normal muscle tone. Coordination normal.  Skin: No rash noted. No erythema.  Psychiatric: She has a normal mood and affect. Her behavior is normal.  Nursing note and vitals reviewed.   ED Course  Procedures (including critical care time)  DIAGNOSTIC STUDIES: Oxygen Saturation is 100% on room air, normal by my interpretation.    COORDINATION OF CARE: At 2153 Discussed treatment plan with patient which includes breathing treatment. Patient agrees.    Labs Review Labs Reviewed - No data to display  Imaging Review No results found.   EKG Interpretation None      MDM   Final diagnoses:  None    Asthma,  Improved with tx.   tx with albuterol hfa  The chart was scribed for me under my direct supervision.  I personally performed the history, physical, and medical decision making and all procedures in the evaluation of this patient.Bailey Mcdonald.   Bailey Catino, MD 03/13/15 [phone]

## 2015-05-02 ENCOUNTER — Emergency Department (HOSPITAL_COMMUNITY)
Admission: EM | Admit: 2015-05-02 | Discharge: 2015-05-03 | Disposition: A | Discharge status: Home / Self Care | Payer: Self-pay | Attending: Emergency Medicine | Admitting: Emergency Medicine

## 2015-05-02 ENCOUNTER — Encounter (HOSPITAL_COMMUNITY): Payer: Self-pay | Admitting: Emergency Medicine

## 2015-05-02 DIAGNOSIS — Z79899 Other long term (current) drug therapy: Secondary | ICD-10-CM | POA: Insufficient documentation

## 2015-05-02 DIAGNOSIS — J45901 Unspecified asthma with (acute) exacerbation: Secondary | ICD-10-CM | POA: Insufficient documentation

## 2015-05-02 DIAGNOSIS — Z862 Personal history of diseases of the blood and blood-forming organs and certain disorders involving the immune mechanism: Secondary | ICD-10-CM | POA: Insufficient documentation

## 2015-05-02 DIAGNOSIS — Z8639 Personal history of other endocrine, nutritional and metabolic disease: Secondary | ICD-10-CM | POA: Insufficient documentation

## 2015-05-02 DIAGNOSIS — Z8739 Personal history of other diseases of the musculoskeletal system and connective tissue: Secondary | ICD-10-CM | POA: Insufficient documentation

## 2015-05-02 DIAGNOSIS — Z8719 Personal history of other diseases of the digestive system: Secondary | ICD-10-CM | POA: Insufficient documentation

## 2015-05-02 MED ORDER — IPRATROPIUM-ALBUTEROL 0.5-2.5 (3) MG/3ML IN SOLN
3.0000 mL | Freq: Once | RESPIRATORY_TRACT | Status: AC
  Administered 2015-05-02: 3 mL via RESPIRATORY_TRACT
  Filled 2015-05-02: qty 3

## 2015-05-02 NOTE — ED Notes (Signed)
Patient complaining of shortness of breath. States used last of inhaler this morning. History of asthma. States used to take singulair, but hasn't in a couple of years.

## 2015-05-03 MED ORDER — DEXAMETHASONE 4 MG PO TABS: 4.0000 mg | ORAL_TABLET | Freq: Two times a day (BID) | ORAL | Status: DC

## 2015-05-03 MED ORDER — ALBUTEROL SULFATE HFA 108 (90 BASE) MCG/ACT IN AERS
INHALATION_SPRAY | RESPIRATORY_TRACT | Status: AC
  Administered 2015-05-03
  Filled 2015-05-03: qty 6.7

## 2015-05-03 MED ORDER — PREDNISONE 50 MG PO TABS
60.0000 mg | ORAL_TABLET | Freq: Once | ORAL | Status: AC
  Administered 2015-05-03: 60 mg via ORAL
  Filled 2015-05-03 (×2): qty 1

## 2015-05-03 MED ORDER — ALBUTEROL SULFATE HFA 108 (90 BASE) MCG/ACT IN AERS
2.0000 | INHALATION_SPRAY | Freq: Once | RESPIRATORY_TRACT | Status: DC
Start: 2015-05-03 — End: 2015-05-03

## 2015-05-03 NOTE — ED Provider Notes (Signed)
CSN: [id number]     Arrival date & time 05/02/15  2329 History   First MD Initiated Contact with Patient 05/02/15 2334     Chief Complaint  Patient presents with  . Shortness of Breath     (Consider location/radiation/quality/duration/timing/severity/associated sxs/prior Treatment) Patient is a 44 y.o. female presenting with shortness of breath. The history is provided by the patient.  Shortness of Breath Severity:  Moderate Duration:  1 day Timing:  Intermittent Progression:  Worsening Chronicity:  Chronic Context: known allergens, pollens and weather changes   Context comment:  History of asthma Relieved by:  Nothing Worsened by:  Weather changes Associated symptoms: wheezing   Associated symptoms: no fever   Risk factors: no hx of PE/DVT, no oral contraceptive use and no tobacco use     Past Medical History  Diagnosis Date  . Asthma   . Arthritis   . IBS (irritable bowel syndrome)   . Hypoglycemia   . Anemia    Past Surgical History  Procedure Laterality Date  . Mouth surgery      removal of teeth  . Tubal ligation    . Knee surgery  05/2011    rtight knee scope chondroplasty patella  . Abdominal hysterectomy    . Knee arthroscopy  09/24/2011    Procedure: ARTHROSCOPY KNEE;  Surgeon: [name], MD;  Location: [facility];  Service: Orthopedics;  Laterality: Left;  . Chondroplasty  09/24/2011    Procedure: CHONDROPLASTY;  Surgeon: [name], MD;  Location: [facility];  Service: Orthopedics;  Laterality: Left;  Chondroplasty Patella   Family History  Problem Relation Age of Onset  . Heart disease    . Arthritis    . Cancer    . Asthma    . Diabetes    . Anesthesia problems Neg Hx   . Hypotension Neg Hx   . Malignant hyperthermia Neg Hx   . Pseudochol deficiency Neg Hx    History  Substance Use Topics  . Smoking status: Never Smoker   . Smokeless tobacco: Not on file  . Alcohol Use: No   OB History    No data available     Review of Systems   Constitutional: Negative for fever.  Respiratory: Positive for shortness of breath and wheezing.   Musculoskeletal: Positive for arthralgias.  All other systems reviewed and are negative.     Allergies  Review of patient's allergies indicates no known allergies.  Home Medications   Prior to Admission medications   Medication Sig Start Date End Date Taking? Authorizing Provider  albuterol (PROVENTIL HFA;VENTOLIN HFA) 108 (90 BASE) MCG/ACT inhaler Inhale 1-2 puffs into the lungs every 6 (six) hours as needed for wheezing or shortness of breath. Patient not taking: Reported on 03/13/2015 08/24/14   Bethann Berkshire, MD  guaiFENesin-codeine [facility]) 100-10 MG/5ML syrup Take 10 mLs by mouth 3 (three) times daily as needed. Patient not taking: Reported on 03/13/2015 09/07/14   Tammy Triplett, PA-C  predniSONE (DELTASONE) 10 MG tablet Take 6 tablets day one, 5 tablets day two, 4 tablets day three, 3 tablets day four, 2 tablets day five, then 1 tablet day six Patient not taking: Reported on 03/13/2015 09/07/14   Tammy Triplett, PA-C   BP 134/76 mmHg  Pulse 79  Temp(Src) 97.8 F (36.6 C) (Oral)  Resp 26  Ht  (1.702 m)  Wt 212 lb (96.163 kg)  BMI 33.20 kg/m2  SpO2 98% Physical Exam  Constitutional: She is oriented to person, place, and  time. She appears well-developed and well-nourished.  Non-toxic appearance.  HENT:  Head: Normocephalic.  Right Ear: Tympanic membrane and external ear normal.  Left Ear: Tympanic membrane and external ear normal.  Eyes: EOM and lids are normal. Pupils are equal, round, and reactive to light.  Neck: Normal range of motion. Neck supple. Carotid bruit is not present.  Cardiovascular: Normal rate, regular rhythm, normal heart sounds, intact distal pulses and normal pulses.   Pulmonary/Chest: Breath sounds normal. No respiratory distress. She exhibits no tenderness.  Course breath sounds with few scattered wheezes. Patient exerting extra effort with  breathing.  Abdominal: Soft. Bowel sounds are normal. There is no tenderness. There is no guarding.  Musculoskeletal: Normal range of motion. She exhibits no edema or tenderness.  Negative Homans sign  Lymphadenopathy:       Head (right side): No submandibular adenopathy present.       Head (left side): No submandibular adenopathy present.    She has no cervical adenopathy.  Neurological: She is alert and oriented to person, place, and time. She has normal strength. No cranial nerve deficit or sensory deficit.  Skin: Skin is warm and dry.  Psychiatric: She has a normal mood and affect. Her speech is normal.  Nursing note and vitals reviewed.   ED Course  Procedures (including critical care time) Labs Review Labs Reviewed - No data to display  Imaging Review No results found.   EKG Interpretation None      MDM  Patient has history of asthma. Patient states she really has a hard time when weather changes or when the pollen count is high. She was maintained with Singulair and rescue nebulizer in the past, but lost her insurance and can no longer afford the Singulair, and has not been seen by a physician other than her visits to the emergency room.  Patient breathing much easier after albuterol treatment. Prednisone also initiated.  Patient given an inhaler here in the emergency department. Prescription for Decadron twice a day also given. The patient is given resource information concerning [facility], and [facility] [place] [facility] [place] [facility], to establish a  primary care physician/clinic. Patient acknowledges understanding of discharge instructions.    Final diagnoses:  None    *I have reviewed nursing notes, vital signs, and all appropriate lab and imaging results for this patient.**    Ivery QualeHobson Keric Zehren, PA-C 05/03/15 0024  Layla MawKristen N Ward, DO 05/03/15 [phone]

## 2015-05-03 NOTE — Discharge Instructions (Signed)
Please use the albuterol inhaler 2 puffs every 4 hours as needed for difficulty with your breathing or wheezing. Please use Decadron 2 times daily with a meal. Please call the [facility], or the [facility] in [place] to establish a primary care physician/clinic that can help you with this ongoing asthma problem. Asthma Asthma is a condition of the lungs in which the airways tighten and narrow. Asthma can make it hard to breathe. Asthma cannot be cured, but medicine and lifestyle changes can help control it. Asthma may be started (triggered) by:  Animal skin flakes (dander).  Dust.  Cockroaches.  Pollen.  Mold.  Smoke.  Cleaning products.  Hair sprays or aerosol sprays.  Paint fumes or strong smells.  Cold air, weather changes, and winds.  Crying or laughing hard.  Stress.  Certain medicines or drugs.  Foods, such as dried fruit, potato chips, and sparkling grape juice.  Infections or conditions (colds, flu).  Exercise.  Certain medical conditions or diseases.  Exercise or tiring activities. HOME CARE   Take medicine as told by your doctor.  Use a peak flow meter as told by your doctor. A peak flow meter is a tool that measures how well the lungs are working.  Record and keep track of the peak flow meter's readings.  Understand and use the asthma action plan. An asthma action plan is a written plan for taking care of your asthma and treating your attacks.  To help prevent asthma attacks:  Do not smoke. Stay away from secondhand smoke.  Change your heating and air conditioning filter often.  Limit your use of fireplaces and wood stoves.  Get rid of pests (such as roaches and mice) and their droppings.  Throw away plants if you see mold on them.  Clean your floors. Dust regularly. Use cleaning products that do not smell.  Have someone vacuum when you are not home. Use a vacuum cleaner with a HEPA filter if  possible.  Replace carpet with wood, tile, or vinyl flooring. Carpet can trap animal skin flakes and dust.  Use allergy-proof pillows, mattress covers, and box spring covers.  Wash bed sheets and blankets every week in hot water and dry them in a dryer.  Use blankets that are made of polyester or cotton.  Clean bathrooms and kitchens with bleach. If possible, have someone repaint the walls in these rooms with mold-resistant paint. Keep out of the rooms that are being cleaned and painted.  Wash hands often. GET HELP IF:  You have make a whistling sound when breaking (wheeze), have shortness of breath, or have a cough even if taking medicine to prevent attacks.  The colored mucus you cough up (sputum) is thicker than usual.  The colored mucus you cough up changes from clear or white to yellow, green, gray, or bloody.  You have problems from the medicine you are taking such as:  A rash.  Itching.  Swelling.  Trouble breathing.  You need reliever medicines more than 2-3 times a week.  Your peak flow measurement is still at 50-79% of your personal best after following the action plan for 1 hour.  You have a fever. GET HELP RIGHT AWAY IF:   You seem to be worse and are not responding to medicine during an asthma attack.  You are short of breath even at rest.  You get short of breath when doing very little activity.  You have trouble eating, drinking, or talking.  You  have chest pain.  You have a fast heartbeat.  Your lips or fingernails start to turn blue.  You are light-headed, dizzy, or faint.  Your peak flow is less than 50% of your personal best. MAKE SURE YOU:   Understand these instructions.  Will watch your condition.  Will get help right away if you are not doing well or get worse. Document Released: 05/17/2008 Document Revised: 04/15/2014 Document Reviewed: 06/28/2013 [facility] Patient Information 2015 [employer]. This information is not intended  to replace advice given to you by your health care provider. Make sure you discuss any questions you have with your health care provider.

## 2015-07-11 ENCOUNTER — Emergency Department (HOSPITAL_COMMUNITY)
Admission: EM | Admit: 2015-07-11 | Discharge: 2015-07-11 | Disposition: A | Discharge status: Home / Self Care | Payer: Self-pay | Attending: Emergency Medicine | Admitting: Emergency Medicine

## 2015-07-11 ENCOUNTER — Emergency Department (HOSPITAL_COMMUNITY): Payer: Self-pay

## 2015-07-11 ENCOUNTER — Encounter (HOSPITAL_COMMUNITY): Payer: Self-pay | Admitting: Emergency Medicine

## 2015-07-11 DIAGNOSIS — Z8739 Personal history of other diseases of the musculoskeletal system and connective tissue: Secondary | ICD-10-CM | POA: Insufficient documentation

## 2015-07-11 DIAGNOSIS — Z79899 Other long term (current) drug therapy: Secondary | ICD-10-CM | POA: Insufficient documentation

## 2015-07-11 DIAGNOSIS — Z862 Personal history of diseases of the blood and blood-forming organs and certain disorders involving the immune mechanism: Secondary | ICD-10-CM | POA: Insufficient documentation

## 2015-07-11 DIAGNOSIS — J45901 Unspecified asthma with (acute) exacerbation: Secondary | ICD-10-CM | POA: Insufficient documentation

## 2015-07-11 DIAGNOSIS — Z8719 Personal history of other diseases of the digestive system: Secondary | ICD-10-CM | POA: Insufficient documentation

## 2015-07-11 LAB — I-STAT CHEM 8, ED
BUN: 9 mg/dL (ref 6–20)
Calcium, Ion: 1.15 mmol/L (ref 1.12–1.23)
Chloride: 105 mmol/L (ref 101–111)
Creatinine, Ser: 0.9 mg/dL (ref 0.44–1.00)
GLUCOSE: 117 mg/dL — AB (ref 65–99)
HCT: 43 % (ref 36.0–46.0)
HEMOGLOBIN: 14.6 g/dL (ref 12.0–15.0)
Potassium: 3.5 mmol/L (ref 3.5–5.1)
SODIUM: 142 mmol/L (ref 135–145)
TCO2: 21 mmol/L (ref 0–100)

## 2015-07-11 LAB — D-DIMER, QUANTITATIVE: D-Dimer, Quant: 0.38 ug/mL-FEU (ref 0.00–0.48)

## 2015-07-11 MED ORDER — PREDNISONE 50 MG PO TABS
60.0000 mg | ORAL_TABLET | Freq: Once | ORAL | Status: AC
  Administered 2015-07-11: 60 mg via ORAL
  Filled 2015-07-11 (×2): qty 1

## 2015-07-11 MED ORDER — ALBUTEROL SULFATE HFA 108 (90 BASE) MCG/ACT IN AERS: 2.0000 | INHALATION_SPRAY | Freq: Four times a day (QID) | RESPIRATORY_TRACT | Status: DC | PRN

## 2015-07-11 MED ORDER — PREDNISONE 50 MG PO TABS: ORAL_TABLET | ORAL | Status: DC

## 2015-07-11 MED ORDER — IPRATROPIUM-ALBUTEROL 0.5-2.5 (3) MG/3ML IN SOLN
3.0000 mL | Freq: Once | RESPIRATORY_TRACT | Status: AC
  Administered 2015-07-11: 3 mL via RESPIRATORY_TRACT
  Filled 2015-07-11: qty 3

## 2015-07-11 MED ORDER — ALBUTEROL SULFATE HFA 108 (90 BASE) MCG/ACT IN AERS
1.0000 | INHALATION_SPRAY | Freq: Once | RESPIRATORY_TRACT | Status: AC
Start: 2015-07-11 — End: 2015-07-11
  Administered 2015-07-11: 2 via RESPIRATORY_TRACT
  Filled 2015-07-11: qty 6.7

## 2015-07-11 NOTE — ED Provider Notes (Signed)
CSN: [id number]     Arrival date & time 07/11/15  2001 History  This chart was scribed for Glynn Octave, MD by Tanda Rockers, ED Scribe. This patient was seen in room [facility] and the patient's care was started at 8:16 PM.  Chief Complaint  Patient presents with  . Asthma   The history is provided by the patient. No language interpreter was used.   HPI Comments: Bailey Mcdonald is a 44 y.o. female who presents to the Emergency Department complaining of asthma exacerbation that began earlier today around 11:30 AM (approximately 9 hours ago). Pt states that she ran out of her albuterol inhaler this morning, causing the exacerbation. Pt reports that she uses her inhaler approximately 4 times on a good day. She denies cough, fever, chest pain, abdominal pain, painful leg swelling, rhinorrhea, sore throat, or any other associated symptoms. The last time pt was admitted to the hospital for asthma was when she was a child. She is a never smoker.    Past Medical History  Diagnosis Date  . Asthma   . Arthritis   . IBS (irritable bowel syndrome)   . Hypoglycemia   . Anemia    Past Surgical History  Procedure Laterality Date  . Mouth surgery      removal of teeth  . Tubal ligation    . Knee surgery  05/2011    rtight knee scope chondroplasty patella  . Abdominal hysterectomy    . Knee arthroscopy  09/24/2011    Procedure: ARTHROSCOPY KNEE;  Surgeon: [name], MD;  Location: [facility];  Service: Orthopedics;  Laterality: Left;  . Chondroplasty  09/24/2011    Procedure: CHONDROPLASTY;  Surgeon: [name], MD;  Location: [facility];  Service: Orthopedics;  Laterality: Left;  Chondroplasty Patella   Family History  Problem Relation Age of Onset  . Heart disease    . Arthritis    . Cancer    . Asthma    . Diabetes    . Anesthesia problems Neg Hx   . Hypotension Neg Hx   . Malignant hyperthermia Neg Hx   . Pseudochol deficiency Neg Hx    History  Substance Use Topics  .  Smoking status: Never Smoker   . Smokeless tobacco: Not on file  . Alcohol Use: No   OB History    No data available     Review of Systems  A complete 10 system review of systems was obtained and all systems are negative except as noted in the HPI and PMH.    Allergies  Review of patient's allergies indicates no known allergies.  Home Medications   Prior to Admission medications   Medication Sig Start Date End Date Taking? Authorizing Provider  albuterol (PROVENTIL HFA;VENTOLIN HFA) 108 (90 BASE) MCG/ACT inhaler Inhale 2 puffs into the lungs every 6 (six) hours as needed for wheezing or shortness of breath. 07/11/15   Glynn Octave, MD  dexamethasone (DECADRON) 4 MG tablet Take 1 tablet (4 mg total) by mouth 2 (two) times daily with a meal. Patient not taking: Reported on 07/11/2015 05/03/15   Ivery Quale, PA-C  guaiFENesin-codeine (ROBITUSSIN AC) 100-10 MG/5ML syrup Take 10 mLs by mouth 3 (three) times daily as needed. Patient not taking: Reported on 03/13/2015 09/07/14   Tammy Triplett, PA-C  predniSONE (DELTASONE) 50 MG tablet 1 tablet PO daily 07/11/15   Glynn Octave, MD   Triage Vitals: BP 129/82 mmHg  Pulse 110  Temp(Src) 98.8 F (37.1 C) (Oral)  Resp  22  Ht  (1.727 m)  Wt 225 lb (102.059 kg)  BMI 34.22 kg/m2  SpO2 100%   Physical Exam  Constitutional: She is oriented to person, place, and time. She appears well-developed and well-nourished. No distress.  Dyspneic with conversation  HENT:  Head: Normocephalic and atraumatic.  Mouth/Throat: Oropharynx is clear and moist. No oropharyngeal exudate.  Eyes: Conjunctivae and EOM are normal. Pupils are equal, round, and reactive to light.  Neck: Normal range of motion. Neck supple.  No meningismus.  Cardiovascular: Normal rate, regular rhythm, normal heart sounds and intact distal pulses.   No murmur heard. Pulmonary/Chest: Effort normal. No respiratory distress. She has no wheezes.  Diminished breath sounds  throughout No wheezing  Abdominal: Soft. There is no tenderness. There is no rebound and no guarding.  Musculoskeletal: Normal range of motion. She exhibits no edema or tenderness.  No leg swelling  Neurological: She is alert and oriented to person, place, and time. No cranial nerve deficit. She exhibits normal muscle tone. Coordination normal.  No ataxia on finger to nose bilaterally. No pronator drift. 5/5 strength throughout. CN 2-12 intact. Negative Romberg. Equal grip strength. Sensation intact. Gait is normal.   Skin: Skin is warm.  Psychiatric: She has a normal mood and affect. Her behavior is normal.  Nursing note and vitals reviewed.   ED Course  Procedures (including critical care time)  DIAGNOSTIC STUDIES: Oxygen Saturation is 100% on RA, normal by my interpretation.    COORDINATION OF CARE: 8:20 PM-Discussed treatment plan which includes breathing treatment, Chem 8, D-Dimer, and CXR with pt at bedside and pt agreed to plan.   9:41 PM-Reevaluated pt after breathing treatment; states that she feels better. Discussed normal CXR and D-dimer with pt as well.  Labs Review Labs Reviewed  I-STAT CHEM 8, ED - Abnormal; Notable for the following:    Glucose, Bld 117 (*)    All other components within normal limits  D-DIMER, QUANTITATIVE (NOT AT [facility])    Imaging Review Dg Chest 2 View  07/11/2015   CLINICAL DATA:  Shortness of breath for 2 hours.  History of asthma.  EXAM: CHEST  2 VIEW  COMPARISON:  12/22/2014  FINDINGS: The heart size and mediastinal contours are within normal limits. Both lungs are clear. The visualized skeletal structures are unremarkable.  IMPRESSION: No active cardiopulmonary disease.   Electronically Signed   By: Charlett Nose M.D.   On: 07/11/2015 21:35     EKG Interpretation None      MDM   Final diagnoses:  Asthma exacerbation    history of asthma difficulty breathing for the past 30 minutes. Out of her inhaler. No chest pain or fever.    Decreased breath sounds with increased work of breathing.   Nebulizers and steroids given. Chest x-ray is negative.  D-dimer is negative.   Patient's lungs are clear. She declined second treatment. She is ambulatory without significant desaturation. We'll refill albuterol and give prednisone course. Return precautions discussed.  I personally performed the services described in this documentation, which was scribed in my presence. The recorded information has been reviewed and is accurate.      Glynn Octave, MD 07/11/15 [phone]

## 2015-07-11 NOTE — ED Notes (Signed)
Ambulate pt on O2. Pt ranged 88%-97% up and down during walk. No asst needed

## 2015-07-11 NOTE — ED Notes (Signed)
Patient states she ran out of her inhaler this morning and started having an asthma attack approximately 30 minutes ago. Patient complaining of shortness of breath.

## 2015-07-11 NOTE — Discharge Instructions (Signed)

## 2015-09-14 ENCOUNTER — Emergency Department (HOSPITAL_COMMUNITY)
Admission: EM | Admit: 2015-09-14 | Discharge: 2015-09-14 | Disposition: A | Discharge status: Home / Self Care | Payer: Self-pay | Attending: Emergency Medicine | Admitting: Emergency Medicine

## 2015-09-14 ENCOUNTER — Emergency Department (HOSPITAL_COMMUNITY): Payer: Self-pay

## 2015-09-14 ENCOUNTER — Encounter (HOSPITAL_COMMUNITY): Payer: Self-pay | Admitting: Emergency Medicine

## 2015-09-14 DIAGNOSIS — J45901 Unspecified asthma with (acute) exacerbation: Secondary | ICD-10-CM | POA: Insufficient documentation

## 2015-09-14 DIAGNOSIS — Z862 Personal history of diseases of the blood and blood-forming organs and certain disorders involving the immune mechanism: Secondary | ICD-10-CM | POA: Insufficient documentation

## 2015-09-14 DIAGNOSIS — Z79899 Other long term (current) drug therapy: Secondary | ICD-10-CM | POA: Insufficient documentation

## 2015-09-14 DIAGNOSIS — M1990 Unspecified osteoarthritis, unspecified site: Secondary | ICD-10-CM | POA: Insufficient documentation

## 2015-09-14 DIAGNOSIS — Z8719 Personal history of other diseases of the digestive system: Secondary | ICD-10-CM | POA: Insufficient documentation

## 2015-09-14 DIAGNOSIS — E119 Type 2 diabetes mellitus without complications: Secondary | ICD-10-CM | POA: Insufficient documentation

## 2015-09-14 MED ORDER — PREDNISONE 50 MG PO TABS: ORAL_TABLET | ORAL | Status: DC

## 2015-09-14 MED ORDER — ALBUTEROL SULFATE HFA 108 (90 BASE) MCG/ACT IN AERS
2.0000 | INHALATION_SPRAY | Freq: Four times a day (QID) | RESPIRATORY_TRACT | Status: DC
  Administered 2015-09-14: 2 via RESPIRATORY_TRACT
  Filled 2015-09-14: qty 6.7

## 2015-09-14 MED ORDER — ALBUTEROL SULFATE HFA 108 (90 BASE) MCG/ACT IN AERS: 2.0000 | INHALATION_SPRAY | Freq: Four times a day (QID) | RESPIRATORY_TRACT | Status: DC | PRN

## 2015-09-14 MED ORDER — PREDNISONE 50 MG PO TABS
60.0000 mg | ORAL_TABLET | Freq: Once | ORAL | Status: AC
  Administered 2015-09-14: 60 mg via ORAL
  Filled 2015-09-14 (×2): qty 1

## 2015-09-14 MED ORDER — IPRATROPIUM-ALBUTEROL 0.5-2.5 (3) MG/3ML IN SOLN
3.0000 mL | Freq: Once | RESPIRATORY_TRACT | Status: AC
  Administered 2015-09-14: 3 mL via RESPIRATORY_TRACT
  Filled 2015-09-14: qty 3

## 2015-09-14 MED ORDER — IPRATROPIUM-ALBUTEROL 0.5-2.5 (3) MG/3ML IN SOLN: 3.0000 mL | Freq: Four times a day (QID) | RESPIRATORY_TRACT | Status: DC

## 2015-09-14 NOTE — Discharge Instructions (Signed)
Asthma °Asthma is a recurring condition in which the airways tighten and narrow. Asthma can make it difficult to breathe. It can cause coughing, wheezing, and shortness of breath. Asthma episodes, also called asthma attacks, range from minor to life-threatening. Asthma cannot be cured, but medicines and lifestyle changes can help control it. °CAUSES °Asthma is believed to be caused by inherited (genetic) and environmental factors, but its exact cause is unknown. Asthma may be triggered by allergens, lung infections, or irritants in the air. Asthma triggers are different for each person. Common triggers include:  °· Animal dander. °· Dust mites. °· Cockroaches. °· Pollen from trees or grass. °· Mold. °· Smoke. °· Air pollutants such as dust, household cleaners, hair sprays, aerosol sprays, paint fumes, strong chemicals, or strong odors. °· Cold air, weather changes, and winds (which increase molds and pollens in the air). °· Strong emotional expressions such as crying or laughing hard. °· Stress. °· Certain medicines (such as aspirin) or types of drugs (such as beta-blockers). °· Sulfites in foods and drinks. Foods and drinks that may contain sulfites include dried fruit, potato chips, and sparkling grape juice. °· Infections or inflammatory conditions such as the flu, a cold, or an inflammation of the nasal membranes (rhinitis). °· Gastroesophageal reflux disease (GERD). °· Exercise or strenuous activity. °SYMPTOMS °Symptoms may occur immediately after asthma is triggered or many hours later. Symptoms include: °· Wheezing. °· Excessive nighttime or early morning coughing. °· Frequent or severe coughing with a common cold. °· Chest tightness. °· Shortness of breath. °DIAGNOSIS  °The diagnosis of asthma is made by a review of your medical history and a physical exam. Tests may also be performed. These may include: °· Lung function studies. These tests show how much air you breathe in and out. °· Allergy  tests. °· Imaging tests such as X-rays. °TREATMENT  °Asthma cannot be cured, but it can usually be controlled. Treatment involves identifying and avoiding your asthma triggers. It also involves medicines. There are 2 classes of medicine used for asthma treatment:  °· Controller medicines. These prevent asthma symptoms from occurring. They are usually taken every day. °· Reliever or rescue medicines. These quickly relieve asthma symptoms. They are used as needed and provide short-term relief. °Your health care provider will help you create an asthma action plan. An asthma action plan is a written plan for managing and treating your asthma attacks. It includes a list of your asthma triggers and how they may be avoided. It also includes information on when medicines should be taken and when their dosage should be changed. An action plan may also involve the use of a device called a peak flow meter. A peak flow meter measures how well the lungs are working. It helps you monitor your condition. °HOME CARE INSTRUCTIONS  °· Take medicines only as directed by your health care provider. Speak with your health care provider if you have questions about how or when to take the medicines. °· Use a peak flow meter as directed by your health care provider. Record and keep track of readings. °· Understand and use the action plan to help minimize or stop an asthma attack without needing to seek medical care. °· Control your home environment in the following ways to help prevent asthma attacks: °¨ Do not smoke. Avoid being exposed to secondhand smoke. °¨ Change your heating and air conditioning filter regularly. °¨ Limit your use of fireplaces and wood stoves. °¨ Get rid of pests (such as roaches and   mice) and their droppings. °¨ Throw away plants if you see mold on them. °¨ Clean your floors and dust regularly. Use unscented cleaning products. °¨ Try to have someone else vacuum for you regularly. Stay out of rooms while they are  being vacuumed and for a short while afterward. If you vacuum, use a dust mask from a hardware store, a double-layered or microfilter vacuum cleaner bag, or a vacuum cleaner with a HEPA filter. °¨ Replace carpet with wood, tile, or vinyl flooring. Carpet can trap dander and dust. °¨ Use allergy-proof pillows, mattress covers, and box spring covers. °¨ Wash bed sheets and blankets every week in hot water and dry them in a dryer. °¨ Use blankets that are made of polyester or cotton. °¨ Clean bathrooms and kitchens with bleach. If possible, have someone repaint the walls in these rooms with mold-resistant paint. Keep out of the rooms that are being cleaned and painted. °¨ Wash hands frequently. °SEEK MEDICAL CARE IF:  °· You have wheezing, shortness of breath, or a cough even if taking medicine to prevent attacks. °· The colored mucus you cough up (sputum) is thicker than usual. °· Your sputum changes from clear or white to yellow, green, gray, or bloody. °· You have any problems that may be related to the medicines you are taking (such as a rash, itching, swelling, or trouble breathing). °· You are using a reliever medicine more than 2-3 times per week. °· Your peak flow is still at 50-79% of your personal best after following your action plan for 1 hour. °· You have a fever. °SEEK IMMEDIATE MEDICAL CARE IF:  °· You seem to be getting worse and are unresponsive to treatment during an asthma attack. °· You are short of breath even at rest. °· You get short of breath when doing very little physical activity. °· You have difficulty eating, drinking, or talking due to asthma symptoms. °· You develop chest pain. °· You develop a fast heartbeat. °· You have a bluish color to your lips or fingernails. °· You are light-headed, dizzy, or faint. °· Your peak flow is less than 50% of your personal best. °MAKE SURE YOU:  °· Understand these instructions. °· Will watch your condition. °· Will get help right away if you are not  doing well or get worse. °Document Released: 11/29/2005 Document Revised: 04/15/2014 Document Reviewed: 06/28/2013 °ExitCare® Patient Information ©2015 [employer]. This information is not intended to replace advice given to you by your health care provider. Make sure you discuss any questions you have with your health care provider. ° ° ° °Emergency Department Resource Guide °1) Find a Doctor and Pay Out of Pocket °Although you won't have to find out who is covered by your insurance plan, it is a good idea to ask around and get recommendations. You will then need to call the office and see if the doctor you have chosen will accept you as a new patient and what types of options they offer for patients who are self-pay. Some doctors offer discounts or will set up payment plans for their patients who do not have insurance, but you will need to ask so you aren't surprised when you get to your appointment. ° °2) Contact Your Local Health Department °Not all health departments have doctors that can see patients for sick visits, but many do, so it is worth a call to see if yours does. If you don't know where your local health department is, you can check   in your phone book. The CDC also has a tool to help you locate your state's health department, and many state websites also have listings of all of their local health departments. ° °3) Find a Walk-in Clinic °If your illness is not likely to be very severe or complicated, you may want to try a walk in clinic. These are popping up all over the country in pharmacies, drugstores, and shopping centers. They're usually staffed by nurse practitioners or physician assistants that have been trained to treat common illnesses and complaints. They're usually fairly quick and inexpensive. However, if you have serious medical issues or chronic medical problems, these are probably not your best option. ° °No Primary Care Doctor: °- Call [facility] at  832-8000 - they can help you  locate a primary care doctor that  accepts your insurance, provides certain services, etc. °- Physician Referral Service- 1-800-533-3463 ° °Chronic Pain Problems: °Organization         Address  Phone   Notes  °[facility]  (336) 297-2271 Patients need to be referred by their primary care doctor.  ° °Medication Assistance: °Organization         Address  Phone   Notes  °[place] [facility] [address] °[place], [place] [address] (336) 641-8030 --Must be a resident of [place] °-- Must have NO insurance coverage whatsoever (no Medicaid/ Medicare, etc.) °-- The pt. MUST have a primary care doctor that directs their care regularly and follows them in the community °  °[facility]  (866) 331-1348   °[employer]  (888) 892-1162   ° °Agencies that provide inexpensive medical care: °Organization         Address  Phone   Notes  °[facility]  (336) 832-8035   °[facility]    (336) 832-7272   °[facility] [address] °[place], [place] [address] (336) 832-4777   °[facility] [address], °[place] (336) 271-4999   °[facility]    (336) 373-0678   °[facility]    (336) 272-1050   °[facility] ° [address], [place] Phone:  (336) 832-4444, Fax:  (336) 832-4440 Hours of Operation:  9 am - 6 pm, M-F.  Also accepts Medicaid/Medicare and self-pay.  °[facility] ° [address], [place] Phone: (336) [phone], Fax: (336) [phone]. Hours of Operation:  8:30 am - 5:30 pm, M-F.  Also accepts Medicaid and self-pay.  °[facility] [place] [address], [place] Phone: (336) 878-6027   °[facility] [address], [place], [place] (336)723-1848, Ext. 123 Mondays & Thursdays: 7-9 AM.  First 15 patients are seen on a first come, first serve basis. °  ° °Medicaid-accepting [place]  Providers: ° °Organization         Address  Phone   Notes  °[facility] [address], [place] (336) [phone] Also accepts self-pay patients.  °[facility] [address], [place] ° (336) 856-9996   °[facility] [address], [place] (336) 288-8857   °[facility] [address] [place] [address], [place] (336) 299-7000   °Veita Bland [address],   ° (336) 373-1557 Only accepts [place] Access Medicaid patients after they have their name applied to their   card.  ° °Self-Pay (no insurance) in [place]: ° °Organization         Address  Phone   Notes  °Sickle Cell Patients, [facility] [address], [place] (336) 832-1970   °[facility] [address], [place] (336) 832-4400   °[facility] ° [address], [place] (336) 992-4800   °[facility]/Dr. Osei-Bonsu ° [address] [place] [address], [place] or [address], [place] (336) [phone] Phone number for both [place] and [place] locations is the same.  °[facility] [address], [place] (336) 299-0000   °[facility] [address] [place] [address], [place] or [address] (336) 852-7530 °(336) 878-2260   °[facility] [address], [place] (336) 350-1642, phone; (336) 294-5005, fax Sees patients 1st and 3rd Saturday of every month.  Must not qualify for public or private insurance (i.e. Medicaid, Medicare, [place] Health Choice, Veterans' Benefits) • Household income should be no more than 200% of the poverty level •The clinic cannot treat you if you are pregnant or think you are pregnant • Sexually transmitted diseases are not treated at the clinic.  ° ° °Dental Care: °Organization         Address  Phone  Notes  °[facility] [address], [place] (336) 641-6152 Accepts children up to age 21 who are enrolled in Medicaid or [place] Health Choice; pregnant women with a Medicaid card; and children who have applied for Medicaid or [place] Health Choice, but were declined, whose parents can pay a reduced fee at time of service.  °[facility] [place]  [address], [place] (336) 641-7733 Accepts children up to age 21 who are enrolled in Medicaid or [place] Health Choice; pregnant women with a Medicaid card; and children who have applied for Medicaid or [place] Health Choice, but were declined, whose parents can pay a reduced fee at time of service.  °[facility] ° [address], [place] (336) 641-4533 Patients are seen by appointment only. Walk-ins are not accepted. [facility] will see patients 18 years of age and older. °Monday - Tuesday (8am-5pm) °Most Wednesdays (8:30-5pm) °$30 per visit, cash only  °[facility] ° [address], [place] (336) [phone] Patients are seen by appointment only. Walk-ins are not accepted. [facility] will see patients 18 years of age and older. °One Wednesday Evening (Monthly: Volunteer Based).  $30 per visit, cash only  °[facility]  (919) [phone] for adults; Children under age 4, call Graduate Pediatric Dentistry at ([phone]) [phone]. Children aged 4-14, please call ([phone]) [phone] to request a pediatric application. ° Dental services are provided in all areas of dental care including fillings, crowns and bridges, complete and partial dentures, implants, gum treatment, root canals, and extractions. Preventive care is also provided. Treatment is provided to both adults and children. °Patients are selected via a lottery and there is often a waiting list. °  °[facility] [address], ° ° (336) 763-8833 [email or web address] °  °[facility]  [address], [place], [place] (336)723-1848, Ext. 123 Second and Fourth Thursday of each month, opens at 6:30 AM; Clinic ends at 9 AM.  Patients are seen on a first-come first-served basis, and   a limited number are seen during each clinic.  ° °[facility] ° [address], [place], [place] (336) 723-7904   Eligibility Requirements °You must have lived in [place], [place], or [place] counties for at least the last three months. °  You cannot be eligible for state or federal sponsored healthcare insurance, including Veterans Administration, Medicaid, or Medicare. °  You generally cannot be eligible for healthcare insurance through your employer.  °  How to apply: °Eligibility screenings are held every Tuesday and Wednesday afternoon from 1:00 pm until 4:00 pm. You do not need an appointment for the interview!  °[facility] [address], [place], [place] 336-631-2330   °[facility]  336-342-8273   °[facility]  336-703-3100   °[facility]  336-570-6415   ° °Behavioral Health Resources in the Community: °Intensive Outpatient Programs °Organization         Address  Phone  Notes  °[facility] [address], [place], [place] 336-878-6098   °[facility] [address], [place], [place] 336-832-9800   °[facility] [address], [place], [place] ° 336-882-2125   °[facility] [address],  °[place], [place] 1-800-853-5163 or 336-641-4981   °Substance Abuse Resources °Organization         Address  Phone  Notes  °[facility]  336-882-2125   °[facility]  336-784-9470   °[facility]  336-285-9073   °[facility]  336-845-3988   °Residential & Outpatient Substance Abuse Program  1-800-659-3381   °Psychological Services °Organization         Address  Phone  Notes  °[facility]  336- 832-9600     °[facility]  336- 378-7881   °[facility] [address], [place] 1-800-853-5163 or 336-641-4981   ° °Mobile Crisis Teams °Organization         Address  Phone  Notes  °[facility], Mobile Crisis Care Unit  1-877-626-1772   °Assertive °[facility] ° [address] [place], [place] 336-834-9664   °Sharon DeEsch [address] °[place] 336-554-5454   ° °Self-Help/Support Groups °Organization         Address  Phone             Notes  °[facility] - variety of support groups  336- 373-1402 Call for more information  °Narcotics Anonymous (NA), [facility] [address], °[place]  2 meetings at this location  ° °Residential Treatment Programs °Organization         Address  Phone  Notes  °[facility] [address],    °[place]  1-866-801-8205   °[facility] ° [address], [place], [place] 704-293-8524   °[facility] [address], [place] 336-845-3988 Admissions: 8am-3pm M-F  °[facility] [address]    °[place], [place] 336-841-1104   °[facility] [address], [place], [place] 336-379-7146   °[facility] [address]  °[place], [place] 336-285-9073   °[facility] - Intensive Outpatient [address], [place], [place] 336-852-3033   °[facility] [address]  °[place], [place] 1-877-615-2722 or 336-784-9470   °[facility] [address] [place],  336-227-7417 Accepts Medicaid  °[facility] [address]  °[place] 1-800-659-3381 Substance Abuse/Addiction Treatment  ° °[place] Behavioral Health Resources °Organization         Address  Phone  Notes  °[facility]  (888) 581-9988   °Julie Brannon, PhD [address] [place], [place]   (336) 349-5553 or (336) 951-0000    °[facility]   [address] °[place], [place] (336) 349-4454   °[facility] [address], [place], [place] (336) 342-8316 Insurance/Medicaid/sponsorship through [facility]  °[facility] [address]                                    [place], [place] (336) 342-8316 Therapy/tele-psych/case  °[facility] [address]  ° [place], [place] (336) 349-2233    °Dr. Arfeen  (336) 349-4544   °[facility] [place]  [employer] [place] [facility] 1) [address], [place] °2) [address], [place] °3)  [address], [place] (336) 349-3220 °(336) 342-7768 ° °(336) 342-8140   °[place] [facility] (336) 342-1394 or (336) 342-3537 (After Hours)    ° ° ° °

## 2015-09-14 NOTE — ED Provider Notes (Signed)
CSN: [id number]     Arrival date & time 09/14/15  0014 History  By signing my name below, I, Bailey Mcdonald, attest that this documentation has been prepared under the direction and in the presence of Bailey Octave, MD. Electronically Signed: Budd Mcdonald, ED Scribe. 09/14/2015. 12:46 AM.    Chief Complaint  Patient presents with  . Shortness of Breath   The history is provided by the patient. No language interpreter was used.   HPI Comments: Bailey Mcdonald is a 44 y.o. female with a PMHx of asthma and Dm (non-medicated) who presents to the Emergency Department complaining of SOB onset today after running out of her albuterol inhaler 1 day ago. She reports associated cough, but states that this is normal when she runs out of her inhaler. She notes she uses this inhaler 2-3 times a day. She notes she does not currently have insurance or a PCP. She states she was last on steroids in July of this year. She denies smoking as well as sick contacts. She denies ever being admitted overnight for her asthma. She denies recent travel and sick contacts. Pt denies fever, rhinorrhea, sore throat, CP, and back pain.    Past Medical History  Diagnosis Date  . Asthma   . Arthritis   . IBS (irritable bowel syndrome)   . Hypoglycemia   . Anemia    Past Surgical History  Procedure Laterality Date  . Mouth surgery      removal of teeth  . Tubal ligation    . Knee surgery  05/2011    rtight knee scope chondroplasty patella  . Abdominal hysterectomy    . Knee arthroscopy  09/24/2011    Procedure: ARTHROSCOPY KNEE;  Surgeon: [name], MD;  Location: [facility];  Service: Orthopedics;  Laterality: Left;  . Chondroplasty  09/24/2011    Procedure: CHONDROPLASTY;  Surgeon: [name], MD;  Location: [facility];  Service: Orthopedics;  Laterality: Left;  Chondroplasty Patella   Family History  Problem Relation Age of Onset  . Heart disease    . Arthritis    . Cancer    . Asthma    . Diabetes     . Anesthesia problems Neg Hx   . Hypotension Neg Hx   . Malignant hyperthermia Neg Hx   . Pseudochol deficiency Neg Hx    Social History  Substance Use Topics  . Smoking status: Never Smoker   . Smokeless tobacco: None  . Alcohol Use: No   OB History    No data available     Review of Systems A complete 10 system review of systems was obtained and all systems are negative except as noted in the HPI and PMH.   Allergies  Review of patient's allergies indicates no known allergies.  Home Medications   Prior to Admission medications   Medication Sig Start Date End Date Taking? Authorizing Provider  albuterol (PROVENTIL HFA;VENTOLIN HFA) 108 (90 BASE) MCG/ACT inhaler Inhale 2 puffs into the lungs every 6 (six) hours as needed for wheezing or shortness of breath. 09/14/15   Bailey Octave, MD  dexamethasone (DECADRON) 4 MG tablet Take 1 tablet (4 mg total) by mouth 2 (two) times daily with a meal. Patient not taking: Reported on 07/11/2015 05/03/15   Ivery Quale, PA-C  guaiFENesin-codeine (ROBITUSSIN AC) 100-10 MG/5ML syrup Take 10 mLs by mouth 3 (three) times daily as needed. Patient not taking: Reported on 03/13/2015 09/07/14   Tammy Triplett, PA-C  predniSONE (DELTASONE) 50 MG tablet  1 tablet PO daily 09/14/15   Bailey Octave, MD   BP 102/79 mmHg  Pulse 82  Temp(Src) 97.7 F (36.5 C) (Oral)  Resp 17  Ht  (1.727 m)  Wt 215 lb (97.523 kg)  BMI 32.70 kg/m2  SpO2 100% Physical Exam  Constitutional: She is oriented to person, place, and time. She appears well-developed and well-nourished. No distress.  HENT:  Head: Normocephalic and atraumatic.  Mouth/Throat: Oropharynx is clear and moist. No oropharyngeal exudate.  Eyes: Conjunctivae and EOM are normal. Pupils are equal, round, and reactive to light.  Neck: Normal range of motion. Neck supple.  No meningismus.  Cardiovascular: Normal rate, regular rhythm, normal heart sounds and intact distal pulses.   No murmur  heard. Pulmonary/Chest: Effort normal. No respiratory distress. She has wheezes.  dyspneic with conversation, moderate air exchange with expiratory wheezing.  Abdominal: Soft. There is no tenderness. There is no rebound and no guarding.  Musculoskeletal: Normal range of motion. She exhibits no edema or tenderness.  no leg swelling  Neurological: She is alert and oriented to person, place, and time. No cranial nerve deficit. She exhibits normal muscle tone. Coordination normal.  No ataxia on finger to nose bilaterally. No pronator drift. 5/5 strength throughout. CN 2-12 intact. Negative Romberg. Equal grip strength. Sensation intact. Gait is normal.   Skin: Skin is warm.  Psychiatric: She has a normal mood and affect. Her behavior is normal.  Nursing note and vitals reviewed.   ED Course  Procedures  DIAGNOSTIC STUDIES: Oxygen Saturation is 100% on RA, normal by my interpretation.    COORDINATION OF CARE: 12:33 AM - Discussed plans to order a breathing treatment and diagnostic imaging. Pt advised of plan for treatment and pt agrees.  Labs Review Labs Reviewed - No data to display  Imaging Review Dg Chest 2 View  09/14/2015   CLINICAL DATA:  Acute onset of shortness of breath and cough. Initial encounter.  EXAM: CHEST  2 VIEW  COMPARISON:  Chest radiograph performed 07/11/2015  FINDINGS: The lungs are well-aerated and clear. There is no evidence of focal opacification, pleural effusion or pneumothorax.  The heart is borderline normal in size. No acute osseous abnormalities are seen.  IMPRESSION: No acute cardiopulmonary process seen.   Electronically Signed   By: Roanna Raider M.D.   On: 09/14/2015 01:48   I have personally reviewed and evaluated these images as part of my medical decision-making.   EKG Interpretation   Date/Time:  Sunday September 14 2015 00:25:49 EDT Ventricular Rate:  76 PR Interval:  130 QRS Duration: 93 QT Interval:  389 QTC Calculation: 437 R Axis:   7 Text  Interpretation:  Sinus rhythm Low voltage, precordial leads No  significant change was found Confirmed by Manus Gunning  MD, Armetta Henri ([id number]) on  09/14/2015 12:45:25 AM      MDM   Final diagnoses:  Asthma attack   Patient complains of asthma attack after running out of her inhaler yesterday. No chest pain, fever. Cough is nonproductive.  Some dyspnea with conversation. No hypoxia. Good air movement. Nebulizer and steroids given.  Patient improved with treatment in the ED. She declined second breathing treatment. Chest x-ray is negative. Low suspicion for ACS or PE. Bronchodilators will be refilled. Also course of steroids will be given.  Ambulatory without desaturation. Patient needs to establish care with PCP. Return precautions discussed.  I personally performed the services described in this documentation, which was scribed in my presence. The recorded information has been  reviewed and is accurate.   Bailey Octave, MD 09/14/15 [phone]

## 2015-09-14 NOTE — ED Notes (Signed)
Patient complaining of shortness of breath, history of asthma. Reports ran out of inhaler on 09/30.

## 2015-09-14 NOTE — ED Notes (Signed)
According to respiratory therapist, pt is in here every month wanting an inhaler.

## 2015-09-14 NOTE — ED Notes (Signed)
Pt ambulated in the hallway on room air and o2 remained 100%

## 2015-11-11 ENCOUNTER — Encounter (HOSPITAL_COMMUNITY): Payer: Self-pay | Admitting: Emergency Medicine

## 2015-11-11 ENCOUNTER — Emergency Department (HOSPITAL_COMMUNITY)
Admission: EM | Admit: 2015-11-11 | Discharge: 2015-11-11 | Disposition: A | Discharge status: Home / Self Care | Payer: Self-pay | Attending: Emergency Medicine | Admitting: Emergency Medicine

## 2015-11-11 DIAGNOSIS — Z8639 Personal history of other endocrine, nutritional and metabolic disease: Secondary | ICD-10-CM | POA: Insufficient documentation

## 2015-11-11 DIAGNOSIS — J45901 Unspecified asthma with (acute) exacerbation: Secondary | ICD-10-CM | POA: Insufficient documentation

## 2015-11-11 DIAGNOSIS — Z7952 Long term (current) use of systemic steroids: Secondary | ICD-10-CM | POA: Insufficient documentation

## 2015-11-11 DIAGNOSIS — M1990 Unspecified osteoarthritis, unspecified site: Secondary | ICD-10-CM | POA: Insufficient documentation

## 2015-11-11 DIAGNOSIS — Z862 Personal history of diseases of the blood and blood-forming organs and certain disorders involving the immune mechanism: Secondary | ICD-10-CM | POA: Insufficient documentation

## 2015-11-11 MED ORDER — IPRATROPIUM-ALBUTEROL 0.5-2.5 (3) MG/3ML IN SOLN
3.0000 mL | Freq: Once | RESPIRATORY_TRACT | Status: AC
  Administered 2015-11-11: 3 mL via RESPIRATORY_TRACT
  Filled 2015-11-11: qty 3

## 2015-11-11 MED ORDER — ALBUTEROL SULFATE HFA 108 (90 BASE) MCG/ACT IN AERS: 2.0000 | INHALATION_SPRAY | RESPIRATORY_TRACT | Status: DC | PRN

## 2015-11-11 MED ORDER — PREDNISONE 20 MG PO TABS: 60.0000 mg | ORAL_TABLET | Freq: Every day | ORAL | Status: DC

## 2015-11-11 MED ORDER — ALBUTEROL SULFATE HFA 108 (90 BASE) MCG/ACT IN AERS
2.0000 | INHALATION_SPRAY | Freq: Once | RESPIRATORY_TRACT | Status: AC
  Administered 2015-11-11: 2 via RESPIRATORY_TRACT
  Filled 2015-11-11: qty 6.7

## 2015-11-11 NOTE — ED Provider Notes (Signed)
TIME SEEN: 5:15 AM  CHIEF COMPLAINT: Dry cough, wheezing  HPI: Pt is a 44 y.o. female with history of asthma who presents to the emergency department with a dry cough, wheezing and started yesterday. Reports she ran out of her albuterol inhaler and feels that her asthma is acting up. No fever. No productive cough. States that changes in weather, allergies, viral illnesses can trigger her asthma. She states that she normally has an asthma exacerbation once a month. Was last on steroids at the beginning of October. States she takes the steroids burst approximately once a month.  ROS: See HPI Constitutional: no fever  Eyes: no drainage  ENT: no runny nose   Cardiovascular:  no chest pain  Resp:  SOB  GI: no vomiting GU: no dysuria Integumentary: no rash  Allergy: no hives  Musculoskeletal: no leg swelling  Neurological: no slurred speech ROS otherwise negative  PAST MEDICAL HISTORY/PAST SURGICAL HISTORY:  Past Medical History  Diagnosis Date  . Asthma   . Arthritis   . IBS (irritable bowel syndrome)   . Hypoglycemia   . Anemia     MEDICATIONS:  Prior to Admission medications   Medication Sig Start Date End Date Taking? Authorizing Provider  albuterol (PROVENTIL HFA;VENTOLIN HFA) 108 (90 BASE) MCG/ACT inhaler Inhale 2 puffs into the lungs every 6 (six) hours as needed for wheezing or shortness of breath. 09/14/15   Glynn Octave, MD  dexamethasone (DECADRON) 4 MG tablet Take 1 tablet (4 mg total) by mouth 2 (two) times daily with a meal. Patient not taking: Reported on 07/11/2015 05/03/15   Ivery Quale, PA-C  guaiFENesin-codeine (ROBITUSSIN AC) 100-10 MG/5ML syrup Take 10 mLs by mouth 3 (three) times daily as needed. Patient not taking: Reported on 03/13/2015 09/07/14   Tammy Triplett, PA-C  predniSONE (DELTASONE) 50 MG tablet 1 tablet PO daily 09/14/15   Glynn Octave, MD    ALLERGIES:  No Known Allergies  SOCIAL HISTORY:  Social History  Substance Use Topics  . Smoking  status: Never Smoker   . Smokeless tobacco: Not on file  . Alcohol Use: No    FAMILY HISTORY: Family History  Problem Relation Age of Onset  . Heart disease    . Arthritis    . Cancer    . Asthma    . Diabetes    . Anesthesia problems Neg Hx   . Hypotension Neg Hx   . Malignant hyperthermia Neg Hx   . Pseudochol deficiency Neg Hx     EXAM: BP 127/87 mmHg  Pulse 95  Temp(Src) 98.2 F (36.8 C) (Oral)  Resp 28  Ht  (1.727 m)  Wt 215 lb (97.523 kg)  BMI 32.70 kg/m2  SpO2 100% CONSTITUTIONAL: Alert and oriented and responds appropriately to questions. Well-appearing; well-nourished HEAD: Normocephalic EYES: Conjunctivae clear, PERRL ENT: normal nose; no rhinorrhea; moist mucous membranes; pharynx without lesions noted NECK: Supple, no meningismus, no LAD  CARD: RRR; S1 and S2 appreciated; no murmurs, no clicks, no rubs, no gallops RESP: Normal chest excursion without splinting or tachypnea; breath sounds clear and equal bilaterally; no wheezes, no rhonchi, no rales, no hypoxia or respiratory distress, speaking full sentences ABD/GI: Normal bowel sounds; non-distended; soft, non-tender, no rebound, no guarding, no peritoneal signs BACK:  The back appears normal and is non-tender to palpation, there is no CVA tenderness EXT: Normal ROM in all joints; non-tender to palpation; no edema; normal capillary refill; no cyanosis, no calf tenderness or swelling    SKIN: Normal  color for age and race; warm NEURO: Moves all extremities equally, sensation to light touch intact diffusely, cranial nerves II through XII intact PSYCH: The patient's mood and manner are appropriate. Grooming and personal hygiene are appropriate.  MEDICAL DECISION MAKING: Patient here asthma exacerbation. When I evaluate patient she has no middle of the breathing treatment and her lungs are completely clear with good aeration. She is no longer to. She speaking full sentences and is not hypoxic with no increased  work of breathing. Patient reports that this is a mild exacerbation. At this time I do not feel she needs to be on steroids and she is frequently on steroids and have discussed with her that there are many side effects with being on steroids this frequently. I will give her a prescription for prednisone burst to start if her symptoms are not improving in the next 24-48 hours. Will discharge her with an albuterol inhaler from the emergency department to take him and give her refills for the same. I also provided her with outpatient follow-up information. Given she has no fever, her lungs are clear with equal and an full aeration diffusely do not feel she needs a chest x-ray at this time. Discussed return precautions. She verbalized understanding and is comfortable with this plan.       Layla MawKristen N Briseyda Fehr, DO 11/11/15 [phone]

## 2015-11-11 NOTE — Discharge Instructions (Signed)

## 2015-11-11 NOTE — ED Notes (Signed)
Pt states she ran out of her inhaler yesterday and presents today with shortness of breath and scattered wheezes.

## 2016-01-09 ENCOUNTER — Encounter (HOSPITAL_COMMUNITY): Payer: Self-pay

## 2016-01-09 ENCOUNTER — Emergency Department (HOSPITAL_COMMUNITY)
Admission: EM | Admit: 2016-01-09 | Discharge: 2016-01-09 | Disposition: A | Discharge status: Home / Self Care | Payer: Self-pay | Attending: Emergency Medicine | Admitting: Emergency Medicine

## 2016-01-09 DIAGNOSIS — Z79899 Other long term (current) drug therapy: Secondary | ICD-10-CM | POA: Insufficient documentation

## 2016-01-09 DIAGNOSIS — Z862 Personal history of diseases of the blood and blood-forming organs and certain disorders involving the immune mechanism: Secondary | ICD-10-CM | POA: Insufficient documentation

## 2016-01-09 DIAGNOSIS — M1990 Unspecified osteoarthritis, unspecified site: Secondary | ICD-10-CM | POA: Insufficient documentation

## 2016-01-09 DIAGNOSIS — Z8719 Personal history of other diseases of the digestive system: Secondary | ICD-10-CM | POA: Insufficient documentation

## 2016-01-09 DIAGNOSIS — Z8639 Personal history of other endocrine, nutritional and metabolic disease: Secondary | ICD-10-CM | POA: Insufficient documentation

## 2016-01-09 DIAGNOSIS — J45901 Unspecified asthma with (acute) exacerbation: Secondary | ICD-10-CM | POA: Insufficient documentation

## 2016-01-09 MED ORDER — PREDNISONE 50 MG PO TABS
60.0000 mg | ORAL_TABLET | Freq: Once | ORAL | Status: AC
  Administered 2016-01-09: 60 mg via ORAL
  Filled 2016-01-09: qty 1

## 2016-01-09 MED ORDER — ALBUTEROL SULFATE HFA 108 (90 BASE) MCG/ACT IN AERS
2.0000 | INHALATION_SPRAY | Freq: Once | RESPIRATORY_TRACT | Status: AC
  Administered 2016-01-09: 2 via RESPIRATORY_TRACT
  Filled 2016-01-09: qty 6.7

## 2016-01-09 MED ORDER — ALBUTEROL SULFATE (2.5 MG/3ML) 0.083% IN NEBU
2.5000 mg | INHALATION_SOLUTION | Freq: Once | RESPIRATORY_TRACT | Status: AC
Start: 2016-01-09 — End: 2016-01-09
  Administered 2016-01-09: 2.5 mg via RESPIRATORY_TRACT
  Filled 2016-01-09: qty 3

## 2016-01-09 MED ORDER — PREDNISONE 20 MG PO TABS: 40.0000 mg | ORAL_TABLET | Freq: Every day | ORAL | Status: DC

## 2016-01-09 MED ORDER — IPRATROPIUM-ALBUTEROL 0.5-2.5 (3) MG/3ML IN SOLN
3.0000 mL | Freq: Once | RESPIRATORY_TRACT | Status: AC
  Administered 2016-01-09: 3 mL via RESPIRATORY_TRACT
  Filled 2016-01-09: qty 3

## 2016-01-09 NOTE — ED Notes (Signed)
Inhaler given along with teaching, teachback

## 2016-01-09 NOTE — ED Notes (Signed)
Pt reports history of asthma.  Reports sob started yesterday around 8:30 pm.  Pt says gets dizzy and light headed when moves around.  Pt says ran out of her inhaler Wednesday.

## 2016-01-09 NOTE — Discharge Instructions (Signed)
Asthma, Adult °Asthma is a condition of the lungs in which the airways tighten and narrow. Asthma can make it hard to breathe. Asthma cannot be cured, but medicine and lifestyle changes can help control it. Asthma may be started (triggered) by: °· Animal skin flakes (dander). °· Dust. °· Cockroaches. °· Pollen. °· Mold. °· Smoke. °· Cleaning products. °· Hair sprays or aerosol sprays. °· Paint fumes or strong smells. °· Cold air, weather changes, and winds. °· Crying or laughing hard. °· Stress. °· Certain medicines or drugs. °· Foods, such as dried fruit, potato chips, and sparkling grape juice. °· Infections or conditions (colds, flu). °· Exercise. °· Certain medical conditions or diseases. °· Exercise or tiring activities. °HOME CARE  °· Take medicine as told by your doctor. °· Use a peak flow meter as told by your doctor. A peak flow meter is a tool that measures how well the lungs are working. °· Record and keep track of the peak flow meter's readings. °· Understand and use the asthma action plan. An asthma action plan is a written plan for taking care of your asthma and treating your attacks. °· To help prevent asthma attacks: °· Do not smoke. Stay away from secondhand smoke. °· Change your heating and air conditioning filter often. °· Limit your use of fireplaces and wood stoves. °· Get rid of pests (such as roaches and mice) and their droppings. °· Throw away plants if you see mold on them. °· Clean your floors. Dust regularly. Use cleaning products that do not smell. °· Have someone vacuum when you are not home. Use a vacuum cleaner with a HEPA filter if possible. °· Replace carpet with wood, tile, or vinyl flooring. Carpet can trap animal skin flakes and dust. °· Use allergy-proof pillows, mattress covers, and box spring covers. °· Wash bed sheets and blankets every week in hot water and dry them in a dryer. °· Use blankets that are made of polyester or cotton. °· Clean bathrooms and kitchens with bleach.  If possible, have someone repaint the walls in these rooms with mold-resistant paint. Keep out of the rooms that are being cleaned and painted. °· Wash hands often. °GET HELP IF: °· You have make a whistling sound when breaking (wheeze), have shortness of breath, or have a cough even if taking medicine to prevent attacks. °· The colored mucus you cough up (sputum) is thicker than usual. °· The colored mucus you cough up changes from clear or white to yellow, green, gray, or bloody. °· You have problems from the medicine you are taking such as: °· A rash. °· Itching. °· Swelling. °· Trouble breathing. °· You need reliever medicines more than 2-3 times a week. °· Your peak flow measurement is still at 50-79% of your personal best after following the action plan for 1 hour. °· You have a fever. °GET HELP RIGHT AWAY IF:  °· You seem to be worse and are not responding to medicine during an asthma attack. °· You are short of breath even at rest. °· You get short of breath when doing very little activity. °· You have trouble eating, drinking, or talking. °· You have chest pain. °· You have a fast heartbeat. °· Your lips or fingernails start to turn blue. °· You are light-headed, dizzy, or faint. °· Your peak flow is less than 50% of your personal best. °  °This information is not intended to replace advice given to you by your health care provider. Make sure   you discuss any questions you have with your health care provider.   Document Released: 05/17/2008 Document Revised: 08/20/2015 Document Reviewed: 06/28/2013 Elsevier Interactive Patient Education 2016 [place] Prevention While you may not be able to control the fact that you have asthma, you can take actions to prevent asthma attacks. The best way to prevent asthma attacks is to maintain good control of your asthma. You can achieve this by:  Taking your medicines as directed.  Avoiding things that can irritate your airways or make your  asthma symptoms worse (asthma triggers).  Keeping track of how well your asthma is controlled and of any changes in your symptoms.  Responding quickly to worsening asthma symptoms (asthma attack).  Seeking emergency care when it is needed. WHAT ARE SOME WAYS TO PREVENT AN ASTHMA ATTACK? Have a Plan Work with your health care provider to create a written plan for managing and treating your asthma attacks (asthma action plan). This plan includes:  A list of your asthma triggers and how you can avoid them.  Information on when medicines should be taken and when their dosages should be changed.  The use of a device that measures how well your lungs are working (peak flow meter). Monitor Your Asthma Use your peak flow meter and record your results in a journal every day. A drop in your peak flow numbers on one or more days may indicate the start of an asthma attack. This can happen even before you start to feel symptoms. You can prevent an asthma attack from getting worse by following the steps in your asthma action plan. Avoid Asthma Triggers Work with your asthma health care provider to find out what your asthma triggers are. This can be done by:  Allergy testing.  Keeping a journal that notes when asthma attacks occur and the factors that may have contributed to them.  Determining if there are other medical conditions that are making your asthma worse. Once you have determined your asthma triggers, take steps to avoid them. This may include avoiding excessive or prolonged exposure to:  Dust. Have someone dust and vacuum your home for you once or twice a week. Using a high-efficiency particulate arrestance (HEPA) vacuum is best.  Smoke. This includes campfire smoke, forest fire smoke, and secondhand smoke from tobacco products.  Pet dander. Avoid contact with animals that you know you are allergic to.  Allergens from trees, grasses or pollens. Avoid spending a lot of time outdoors when  pollen counts are high, and on very windy days.  Very cold, dry, or humid air.  Mold.  Foods that contain high amounts of sulfites.  Strong odors.  Outdoor air pollutants, such as Lexicographer.  Indoor air pollutants, such as aerosol sprays and fumes from household cleaners.  Household pests, including dust mites and cockroaches, and pest droppings.  Certain medicines, including NSAIDs. Always talk to your health care provider before stopping or starting any new medicines. Medicines Take over-the-counter and prescription medicines only as told by your health care provider. Many asthma attacks can be prevented by carefully following your medicine schedule. Taking your medicines correctly is especially important when you cannot avoid certain asthma triggers. Act Quickly If an asthma attack does happen, acting quickly can decrease how severe it is and how long it lasts. Take these steps:   Pay attention to your symptoms. If you are coughing, wheezing, or having difficulty breathing, do not wait to see if your symptoms go away on their  own. Follow your asthma action plan. °· If you have followed your asthma action plan and your symptoms are not improving, call your health care provider or seek immediate medical care at the nearest hospital. °It is important to note how often you need to use your fast-acting rescue inhaler. If you are using your rescue inhaler more often, it may mean that your asthma is not under control. Adjusting your asthma treatment plan may help you to prevent future asthma attacks and help you to gain better control of your condition. °HOW CAN I PREVENT AN ASTHMA ATTACK WHEN I EXERCISE? °Follow advice from your health care provider about whether you should use your fast-acting inhaler before exercising. Many people with asthma experience exercise-induced bronchoconstriction (EIB). This condition often worsens during vigorous exercise in cold, humid, or dry environments.  Usually, people with EIB can stay very active by pre-treating with a fast-acting inhaler before exercising. °  °This information is not intended to replace advice given to you by your health care provider. Make sure you discuss any questions you have with your health care provider. °  °Document Released: 11/17/2009 Document Revised: 08/20/2015 Document Reviewed: 05/01/2015 °Elsevier Interactive Patient Education ©2016 [employer] ° °

## 2016-01-09 NOTE — ED Notes (Signed)
Tammy, PA at bedside.  

## 2016-01-11 NOTE — ED Provider Notes (Signed)
CSN: [id number]     Arrival date & time 01/09/16  9147 History   First MD Initiated Contact with Patient 01/09/16 [phone]     Chief Complaint  Patient presents with  . Shortness of Breath     (Consider location/radiation/quality/duration/timing/severity/associated sxs/prior Treatment) HPI  Bailey Mcdonald is a 45 y.o. female who presents to the Emergency Department complaining of shortness of breath, occasional non-productive cough and hx of asthma.  symptoms began on the evening prior to arrival. She reports wheezing, feelings of being "light-headed" and chest tightness as associated symptoms.  She states she ran of of her albuterol inhaler three days prior.  She has been sitting outside in the cold air several times throughout the day to help alleviate her symptoms.  She denies fever, chest pain, abdominal pain and hemoptysis.     Past Medical History  Diagnosis Date  . Asthma   . Arthritis   . IBS (irritable bowel syndrome)   . Hypoglycemia   . Anemia    Past Surgical History  Procedure Laterality Date  . Mouth surgery      removal of teeth  . Tubal ligation    . Knee surgery  05/2011    rtight knee scope chondroplasty patella  . Abdominal hysterectomy    . Knee arthroscopy  09/24/2011    Procedure: ARTHROSCOPY KNEE;  Surgeon: [name], MD;  Location: [facility];  Service: Orthopedics;  Laterality: Left;  . Chondroplasty  09/24/2011    Procedure: CHONDROPLASTY;  Surgeon: [name], MD;  Location: [facility];  Service: Orthopedics;  Laterality: Left;  Chondroplasty Patella   Family History  Problem Relation Age of Onset  . Heart disease    . Arthritis    . Cancer    . Asthma    . Diabetes    . Anesthesia problems Neg Hx   . Hypotension Neg Hx   . Malignant hyperthermia Neg Hx   . Pseudochol deficiency Neg Hx    Social History  Substance Use Topics  . Smoking status: Never Smoker   . Smokeless tobacco: None  . Alcohol Use: No   OB History    No data  available     Review of Systems  Constitutional: Negative for fever, chills and appetite change.  HENT: Negative for congestion, sore throat and trouble swallowing.   Respiratory: Positive for cough, chest tightness, shortness of breath and wheezing.   Cardiovascular: Negative for chest pain.  Gastrointestinal: Negative for nausea, vomiting and abdominal pain.  Genitourinary: Negative for dysuria.  Musculoskeletal: Negative for arthralgias.  Skin: Negative for rash.  Neurological: Negative for dizziness, weakness and numbness.  Hematological: Negative for adenopathy.  All other systems reviewed and are negative.     Allergies  Review of patient's allergies indicates no known allergies.  Home Medications   Prior to Admission medications   Medication Sig Start Date End Date Taking? Authorizing Provider  albuterol (PROVENTIL HFA;VENTOLIN HFA) 108 (90 BASE) MCG/ACT inhaler Inhale 2 puffs into the lungs every 6 (six) hours as needed for wheezing or shortness of breath. 09/14/15  Yes Glynn Octave, MD  ibuprofen (ADVIL,MOTRIN) 400 MG tablet Take 400 mg by mouth every 6 (six) hours as needed for moderate pain.   Yes Historical Provider, MD  albuterol (PROVENTIL HFA;VENTOLIN HFA) 108 (90 BASE) MCG/ACT inhaler Inhale 2 puffs into the lungs every 4 (four) hours as needed for wheezing or shortness of breath. 11/11/15   Kristen N Ward, DO  predniSONE (DELTASONE) 20 MG tablet  Take 2 tablets (40 mg total) by mouth daily. For 5 days 01/09/16   Enjoli Tidd, PA-C   BP 93/82 mmHg  Pulse 89  Temp(Src) 97.7 F (36.5 C) (Oral)  Resp 25  Ht  (1.727 m)  Wt 99.791 kg  BMI 33.46 kg/m2  SpO2 100% Physical Exam  Constitutional: She is oriented to person, place, and time. She appears well-developed and well-nourished. No distress.  HENT:  Head: Normocephalic and atraumatic.  Right Ear: Tympanic membrane and ear canal normal.  Left Ear: Tympanic membrane and ear canal normal.  Mouth/Throat:  Uvula is midline, oropharynx is clear and moist and mucous membranes are normal. No oropharyngeal exudate.  Eyes: EOM are normal. Pupils are equal, round, and reactive to light.  Neck: Normal range of motion, full passive range of motion without pain and phonation normal. Neck supple.  Cardiovascular: Normal rate, regular rhythm, normal heart sounds and intact distal pulses.   No murmur heard. Pulmonary/Chest: Effort normal. No stridor. No respiratory distress. She has no rales. She exhibits no tenderness.  Diminished lung sounds bilaterally.  Few inspiratory wheezes bilaterally.  No rales  Abdominal: Soft. She exhibits no distension. There is no tenderness. There is no rebound.  Musculoskeletal: Normal range of motion. She exhibits no edema.  Lymphadenopathy:    She has no cervical adenopathy.  Neurological: She is alert and oriented to person, place, and time. She exhibits normal muscle tone. Coordination normal.  Skin: Skin is warm and dry.  Nursing note and vitals reviewed.   ED Course  Procedures (including critical care time) Labs Review Labs Reviewed - No data to display  Imaging Review No results found. I have personally reviewed and evaluated these images and lab results as part of my medical decision-making.   EKG Interpretation   Date/Time:  Friday January 09 2016 09:48:51 EST Ventricular Rate:  88 PR Interval:  134 QRS Duration: 112 QT Interval:  375 QTC Calculation: 454 R Axis:   -20 Text Interpretation:  Sinus rhythm Borderline intraventricular conduction  delay Low voltage, precordial leads Baseline wander in lead(s) III aVF ED  PHYSICIAN INTERPRETATION AVAILABLE IN [facility] Confirmed by TEST,  Record ([id number]) on 01/10/2016 12:21:38 PM      MDM   Final diagnoses:  Asthma exacerbation    Pt feels much better after albuterol nebulizer and prednisone.   She reports resolution of her symptoms.  PERC score indicates low probability for PE.  Albuterol MDI  dispensed.  Rx for prednisone. Pt stable for d/c and agrees to return if needed    Pauline Aus, Cordelia Poche 01/11/16 2033  Benjiman Core, MD 01/12/16 [phone]

## 2016-03-17 ENCOUNTER — Emergency Department (HOSPITAL_COMMUNITY): Payer: Self-pay

## 2016-03-17 ENCOUNTER — Emergency Department (HOSPITAL_COMMUNITY)
Admission: EM | Admit: 2016-03-17 | Discharge: 2016-03-17 | Disposition: A | Discharge status: Home / Self Care | Payer: Self-pay | Attending: Emergency Medicine | Admitting: Emergency Medicine

## 2016-03-17 ENCOUNTER — Encounter (HOSPITAL_COMMUNITY): Payer: Self-pay

## 2016-03-17 DIAGNOSIS — J9801 Acute bronchospasm: Secondary | ICD-10-CM

## 2016-03-17 DIAGNOSIS — J4540 Moderate persistent asthma, uncomplicated: Secondary | ICD-10-CM | POA: Insufficient documentation

## 2016-03-17 MED ORDER — ALBUTEROL SULFATE HFA 108 (90 BASE) MCG/ACT IN AERS: 2.0000 | INHALATION_SPRAY | RESPIRATORY_TRACT | Status: DC | PRN

## 2016-03-17 MED ORDER — ALBUTEROL SULFATE HFA 108 (90 BASE) MCG/ACT IN AERS
2.0000 | INHALATION_SPRAY | Freq: Once | RESPIRATORY_TRACT | Status: AC
  Administered 2016-03-17: 2 via RESPIRATORY_TRACT
  Filled 2016-03-17: qty 6.7

## 2016-03-17 MED ORDER — ALBUTEROL SULFATE (2.5 MG/3ML) 0.083% IN NEBU
5.0000 mg | INHALATION_SOLUTION | Freq: Once | RESPIRATORY_TRACT | Status: AC
  Administered 2016-03-17: 5 mg via RESPIRATORY_TRACT
  Filled 2016-03-17: qty 6

## 2016-03-17 MED ORDER — AEROCHAMBER PLUS FLO-VU MEDIUM MISC
1.0000 | Freq: Once | Status: AC
  Administered 2016-03-17: 1
  Filled 2016-03-17: qty 1

## 2016-03-17 NOTE — ED Provider Notes (Signed)
CSN: [id number]     Arrival date & time 03/17/16  2033 History  By signing my name below, I, Linus Galas, attest that this documentation has been prepared under the direction and in the presence of Lavera Guise, MD. Electronically Signed: Linus Galas, ED Scribe. 03/17/2016. 10:32 PM.   Chief Complaint  Patient presents with  . Shortness of Breath    `   The history is provided by the patient. No language interpreter was used.  HPI Comments: Bailey Mcdonald is a 45 y.o. female who presents to the Emergency Department with a PMHx asthma complaining of shortness of breath.   Pt reports she uses her albuterol inhaler twice a day but she ran out 10:30 AM this morning. Unable to take evening dose of medication, and  since then, she feels her asthma is bothering her since she did not have her routine dose. Pt was previous on Advair and then switched to Singulair but she no longer has insurance therefore cannot afford it any more. She states that she usually receives steroids every couple of months. She denies any hospitlazations or intubation for her asthma Pt denies any fevers, cough, CP, leg swelling, leg pain, or any other symptoms at this time.   Past Medical History  Diagnosis Date  . Asthma   . Arthritis   . IBS (irritable bowel syndrome)   . Hypoglycemia   . Anemia    Past Surgical History  Procedure Laterality Date  . Mouth surgery      removal of teeth  . Tubal ligation    . Knee surgery  05/2011    rtight knee scope chondroplasty patella  . Abdominal hysterectomy    . Knee arthroscopy  09/24/2011    Procedure: ARTHROSCOPY KNEE;  Surgeon: [name], MD;  Location: [facility];  Service: Orthopedics;  Laterality: Left;  . Chondroplasty  09/24/2011    Procedure: CHONDROPLASTY;  Surgeon: [name], MD;  Location: [facility];  Service: Orthopedics;  Laterality: Left;  Chondroplasty Patella   Family History  Problem Relation Age of Onset  . Heart disease    . Arthritis    .  Cancer    . Asthma    . Diabetes    . Anesthesia problems Neg Hx   . Hypotension Neg Hx   . Malignant hyperthermia Neg Hx   . Pseudochol deficiency Neg Hx    Social History  Substance Use Topics  . Smoking status: Never Smoker   . Smokeless tobacco: None  . Alcohol Use: No   OB History    No data available     Review of Systems  Constitutional: Negative for fever.  Respiratory: Positive for shortness of breath. Negative for cough.   Cardiovascular: Negative for chest pain and leg swelling.  All other systems reviewed and are negative.   Allergies  Review of patient's allergies indicates no known allergies.  Home Medications   Prior to Admission medications   Medication Sig Start Date End Date Taking? Authorizing Provider  albuterol (PROVENTIL HFA;VENTOLIN HFA) 108 (90 BASE) MCG/ACT inhaler Inhale 2 puffs into the lungs every 6 (six) hours as needed for wheezing or shortness of breath. 09/14/15   Glynn Octave, MD  albuterol (PROVENTIL HFA;VENTOLIN HFA) 108 (90 BASE) MCG/ACT inhaler Inhale 2 puffs into the lungs every 4 (four) hours as needed for wheezing or shortness of breath. 11/11/15   Kristen N Ward, DO  albuterol (PROVENTIL HFA;VENTOLIN HFA) 108 (90 Base) MCG/ACT inhaler Inhale 2 puffs into  the lungs every 4 (four) hours as needed for wheezing or shortness of breath. 03/17/16   Lavera Guise, MD  ibuprofen (ADVIL,MOTRIN) 400 MG tablet Take 400 mg by mouth every 6 (six) hours as needed for moderate pain.    Historical Provider, MD  predniSONE (DELTASONE) 20 MG tablet Take 2 tablets (40 mg total) by mouth daily. For 5 days 01/09/16   Tammy Triplett, PA-C   BP 122/84 mmHg  Pulse 91  Temp(Src) 98.3 F (36.8 C) (Oral)  Resp 20  Ht  (1.727 m)  Wt 208 lb (94.348 kg)  BMI 31.63 kg/m2  SpO2 100%   Physical Exam Nursing note and vitals reviewed. Constitutional: Well developed, well nourished, non-toxic, and in no acute distress Head: Normocephalic and atraumatic.   Mouth/Throat: Oropharynx is clear and moist.  Neck: Normal range of motion. Neck supple.  Cardiovascular: Normal rate and regular rhythm.   Pulmonary/Chest: Effort normal and breath sounds normal.  Abdominal: Soft. There is no tenderness. There is no rebound and no guarding.  Musculoskeletal: Normal range of motion.  Neurological: Alert, no facial droop, fluent speech, moves all extremities symmetrically Skin: Skin is warm and dry.  Psychiatric: Cooperative  ED Course  Procedures   DIAGNOSTIC STUDIES: Oxygen Saturation is 100% on room air, normal by my interpretation.    COORDINATION OF CARE: 10:28 PM Will give breathing treatment. Will order CXR and EKG. Discussed treatment plan including Rx for alburterol with pt at bedside and pt agreed to plan.  Labs Review Labs Reviewed - No data to display  Imaging Review Dg Chest 2 View  03/17/2016  CLINICAL DATA:  Shortness of breath and history of asthma. EXAM: CHEST - 2 VIEW COMPARISON:  09/14/2015 FINDINGS: The heart size and mediastinal contours are within normal limits. Lung volumes are normal. There is no evidence of pulmonary edema, consolidation, pneumothorax, nodule or pleural fluid. The visualized skeletal structures are unremarkable. IMPRESSION: No active disease. Electronically Signed   By: Irish Lack M.D.   On: 03/17/2016 21:15   I have personally reviewed and evaluated these images and lab results as part of my medical decision-making.   EKG Interpretation   Date/Time:  Wednesday March 17 2016 20:47:57 EDT Ventricular Rate:  92 PR Interval:  136 QRS Duration: 80 QT Interval:  348 QTC Calculation: 430 R Axis:   -7 Text Interpretation:  Normal sinus rhythm Cannot rule out Anterior infarct  , age undetermined Abnormal ECG No significant change since last tracing  Confirmed by Jlyn Cerros MD, Sherrel Ploch [phone]) on 03/17/2016 10:26:08 PM      MDM   Final diagnoses:  Asthma, moderate persistent, uncomplicated  Acute bronchospasm     45 year old female with history of asthma who presents with shortness of breath after running out of her inhaler today. She received 1 breathing treatment prior to my evaluation, and states that she is at her baseline. She on my evaluation is clear breath sounds, breathing comfortably, and in no respiratory distress. A chest x-ray does not show any acute cardiopulmonary processes. EKG is unremarkable. not requiring steroids at this time. Is given an inhaler as well as prescription for inhalers for home. Given resources for outpatient follow-up. Strict return and follow-up instructions reviewed. She expressed understanding of all discharge instructions and felt comfortable with the plan of care.   I personally performed the services described in this documentation, which was scribed in my presence. The recorded information has been reviewed and is accurate.    Lavera Guise,  MD 03/18/16 0002

## 2016-03-17 NOTE — Discharge Instructions (Signed)
Return without fail for worsening symptoms, including her sitting shortness of breath, severe chest pain, passing out, or any other symptoms concerning to you.  Asthma Attack Prevention While you may not be able to control the fact that you have asthma, you can take actions to prevent asthma attacks. The best way to prevent asthma attacks is to maintain good control of your asthma. You can achieve this by:  Taking your medicines as directed.  Avoiding things that can irritate your airways or make your asthma symptoms worse (asthma triggers).  Keeping track of how well your asthma is controlled and of any changes in your symptoms.  Responding quickly to worsening asthma symptoms (asthma attack).  Seeking emergency care when it is needed. WHAT ARE SOME WAYS TO PREVENT AN ASTHMA ATTACK? Have a Plan Work with your health care provider to create a written plan for managing and treating your asthma attacks (asthma action plan). This plan includes:  A list of your asthma triggers and how you can avoid them.  Information on when medicines should be taken and when their dosages should be changed.  The use of a device that measures how well your lungs are working (peak flow meter). Monitor Your Asthma Use your peak flow meter and record your results in a journal every day. A drop in your peak flow numbers on one or more days may indicate the start of an asthma attack. This can happen even before you start to feel symptoms. You can prevent an asthma attack from getting worse by following the steps in your asthma action plan. Avoid Asthma Triggers Work with your asthma health care provider to find out what your asthma triggers are. This can be done by:  Allergy testing.  Keeping a journal that notes when asthma attacks occur and the factors that may have contributed to them.  Determining if there are other medical conditions that are making your asthma worse. Once you have determined your asthma  triggers, take steps to avoid them. This may include avoiding excessive or prolonged exposure to:  Dust. Have someone dust and vacuum your home for you once or twice a week. Using a high-efficiency particulate arrestance (HEPA) vacuum is best.  Smoke. This includes campfire smoke, forest fire smoke, and secondhand smoke from tobacco products.  Pet dander. Avoid contact with animals that you know you are allergic to.  Allergens from trees, grasses or pollens. Avoid spending a lot of time outdoors when pollen counts are high, and on very windy days.  Very cold, dry, or humid air.  Mold.  Foods that contain high amounts of sulfites.  Strong odors.  Outdoor air pollutants, such as Museum/gallery exhibitions officer.  Indoor air pollutants, such as aerosol sprays and fumes from household cleaners.  Household pests, including dust mites and cockroaches, and pest droppings.  Certain medicines, including NSAIDs. Always talk to your health care provider before stopping or starting any new medicines. Medicines Take over-the-counter and prescription medicines only as told by your health care provider. Many asthma attacks can be prevented by carefully following your medicine schedule. Taking your medicines correctly is especially important when you cannot avoid certain asthma triggers. Act Quickly If an asthma attack does happen, acting quickly can decrease how severe it is and how long it lasts. Take these steps:   Pay attention to your symptoms. If you are coughing, wheezing, or having difficulty breathing, do not wait to see if your symptoms go away on their own. Follow your asthma action plan.  If you have followed your asthma action plan and your symptoms are not improving, call your health care provider or seek immediate medical care at the nearest hospital. It is important to note how often you need to use your fast-acting rescue inhaler. If you are using your rescue inhaler more often, it may mean that your  asthma is not under control. Adjusting your asthma treatment plan may help you to prevent future asthma attacks and help you to gain better control of your condition. HOW CAN I PREVENT AN ASTHMA ATTACK WHEN I EXERCISE? Follow advice from your health care provider about whether you should use your fast-acting inhaler before exercising. Many people with asthma experience exercise-induced bronchoconstriction (EIB). This condition often worsens during vigorous exercise in cold, humid, or dry environments. Usually, people with EIB can stay very active by pre-treating with a fast-acting inhaler before exercising.   This information is not intended to replace advice given to you by your health care provider. Make sure you discuss any questions you have with your health care provider.   Document Released: 11/17/2009 Document Revised: 08/20/2015 Document Reviewed: 05/01/2015 Elsevier Interactive Patient Education 2016 Else[employer] Inc[employer]  Asthma, Adult Asthma is a condition of the lungs in which the airways tighten and narrow. Asthma can make it hard to breathe. Asthma cannot be cured, but medicine and lifestyle changes can help control it. Asthma may be started (triggered) by:  Animal skin flakes (dander).  Dust.  Cockroaches.  Pollen.  Mold.  Smoke.  Cleaning products.  Hair sprays or aerosol sprays.  Paint fumes or strong smells.  Cold air, weather changes, and winds.  Crying or laughing hard.  Stress.  Certain medicines or drugs.  Foods, such as dried fruit, potato chips, and sparkling grape juice.  Infections or conditions (colds, flu).  Exercise.  Certain medical conditions or diseases.  Exercise or tiring activities. HOME CARE   Take medicine as told by your doctor.  Use a peak flow meter as told by your doctor. A peak flow meter is a tool that measures how well the lungs are working.  Record and keep track of the peak flow meter's readings.  Understand and use the  asthma action plan. An asthma action plan is a written plan for taking care of your asthma and treating your attacks.  To help prevent asthma attacks:  Do not smoke. Stay away from secondhand smoke.  Change your heating and air conditioning filter often.  Limit your use of fireplaces and wood stoves.  Get rid of pests (such as roaches and mice) and their droppings.  Throw away plants if you see mold on them.  Clean your floors. Dust regularly. Use cleaning products that do not smell.  Have someone vacuum when you are not home. Use a vacuum cleaner with a HEPA filter if possible.  Replace carpet with wood, tile, or vinyl flooring. Carpet can trap animal skin flakes and dust.  Use allergy-proof pillows, mattress covers, and box spring covers.  Wash bed sheets and blankets every week in hot water and dry them in a dryer.  Use blankets that are made of polyester or cotton.  Clean bathrooms and kitchens with bleach. If possible, have someone repaint the walls in these rooms with mold-resistant paint. Keep out of the rooms that are being cleaned and painted.  Wash hands often. GET HELP IF:  You have make a whistling sound when breaking (wheeze), have shortness of breath, or have a cough even if taking medicine to  prevent attacks.  The colored mucus you cough up (sputum) is thicker than usual.  The colored mucus you cough up changes from clear or white to yellow, green, gray, or bloody.  You have problems from the medicine you are taking such as:  A rash.  Itching.  Swelling.  Trouble breathing.  You need reliever medicines more than 2-3 times a week.  Your peak flow measurement is still at 50-79% of your personal best after following the action plan for 1 hour.  You have a fever. GET HELP RIGHT AWAY IF:   You seem to be worse and are not responding to medicine during an asthma attack.  You are short of breath even at rest.  You get short of breath when doing very  little activity.  You have trouble eating, drinking, or talking.  You have chest pain.  You have a fast heartbeat.  Your lips or fingernails start to turn blue.  You are light-headed, dizzy, or faint.  Your peak flow is less than 50% of your personal best.   This information is not intended to replace advice given to you by your health care provider. Make sure you discuss any questions you have with your health care provider.   Document Released: 05/17/2008 Document Revised: 08/20/2015 Document Reviewed: 06/28/2013 Elsevier Interactive Patient Education [employer]2016 [employer] Inc[employer]

## 2016-03-17 NOTE — ED Notes (Signed)
Patient states asthma flare up that started this morning. Patient states she ran out of her inhaler this morning.

## 2016-05-02 ENCOUNTER — Emergency Department (HOSPITAL_COMMUNITY)
Admission: EM | Admit: 2016-05-02 | Discharge: 2016-05-02 | Disposition: A | Discharge status: Home / Self Care | Payer: Self-pay | Attending: Emergency Medicine | Admitting: Emergency Medicine

## 2016-05-02 ENCOUNTER — Encounter (HOSPITAL_COMMUNITY): Payer: Self-pay | Admitting: Emergency Medicine

## 2016-05-02 DIAGNOSIS — J45909 Unspecified asthma, uncomplicated: Secondary | ICD-10-CM | POA: Insufficient documentation

## 2016-05-02 DIAGNOSIS — Z79899 Other long term (current) drug therapy: Secondary | ICD-10-CM | POA: Insufficient documentation

## 2016-05-02 DIAGNOSIS — M1990 Unspecified osteoarthritis, unspecified site: Secondary | ICD-10-CM | POA: Insufficient documentation

## 2016-05-02 DIAGNOSIS — Z791 Long term (current) use of non-steroidal anti-inflammatories (NSAID): Secondary | ICD-10-CM | POA: Insufficient documentation

## 2016-05-02 MED ORDER — IPRATROPIUM-ALBUTEROL 0.5-2.5 (3) MG/3ML IN SOLN
3.0000 mL | Freq: Once | RESPIRATORY_TRACT | Status: AC
  Administered 2016-05-02: 3 mL via RESPIRATORY_TRACT
  Filled 2016-05-02: qty 3

## 2016-05-02 MED ORDER — ALBUTEROL SULFATE HFA 108 (90 BASE) MCG/ACT IN AERS
2.0000 | INHALATION_SPRAY | RESPIRATORY_TRACT | Status: DC | PRN
  Administered 2016-05-02: 2 via RESPIRATORY_TRACT
  Filled 2016-05-02: qty 6.7

## 2016-05-02 MED ORDER — ALBUTEROL SULFATE (2.5 MG/3ML) 0.083% IN NEBU
2.5000 mg | INHALATION_SOLUTION | Freq: Once | RESPIRATORY_TRACT | Status: AC
  Administered 2016-05-02: 2.5 mg via RESPIRATORY_TRACT
  Filled 2016-05-02: qty 3

## 2016-05-02 MED ORDER — ALBUTEROL SULFATE HFA 108 (90 BASE) MCG/ACT IN AERS: 2.0000 | INHALATION_SPRAY | RESPIRATORY_TRACT | Status: DC | PRN

## 2016-05-02 MED ORDER — DEXAMETHASONE SODIUM PHOSPHATE 4 MG/ML IJ SOLN
10.0000 mg | Freq: Once | INTRAMUSCULAR | Status: AC
  Administered 2016-05-02: 10 mg via INTRAVENOUS
  Filled 2016-05-02: qty 3

## 2016-05-02 NOTE — ED Provider Notes (Signed)
CSN: [id number]     Arrival date & time 05/02/16  1733 History   First MD Initiated Contact with Patient 05/02/16 1740     Chief Complaint  Patient presents with  . Shortness of Breath  . Wheezing      HPI Patient has a known history of asthma ran out of her inhaler today.  She comes in with wheezing.  Denies any fever chills.  No productive cough.  Denies abdominal pain or other symptoms. Past Medical History  Diagnosis Date  . Asthma   . Arthritis   . IBS (irritable bowel syndrome)   . Hypoglycemia   . Anemia    Past Surgical History  Procedure Laterality Date  . Mouth surgery      removal of teeth  . Tubal ligation    . Knee surgery  05/2011    rtight knee scope chondroplasty patella  . Abdominal hysterectomy    . Knee arthroscopy  09/24/2011    Procedure: ARTHROSCOPY KNEE;  Surgeon: Fuller CanadaStanley Harrison, MD;  Location: [facility];  Service: Orthopedics;  Laterality: Left;  . Chondroplasty  09/24/2011    Procedure: CHONDROPLASTY;  Surgeon: Fuller CanadaStanley Harrison, MD;  Location: [facility];  Service: Orthopedics;  Laterality: Left;  Chondroplasty Patella   Family History  Problem Relation Age of Onset  . Heart disease    . Arthritis    . Cancer    . Asthma    . Diabetes    . Anesthesia problems Neg Hx   . Hypotension Neg Hx   . Malignant hyperthermia Neg Hx   . Pseudochol deficiency Neg Hx    Social History  Substance Use Topics  . Smoking status: Never Smoker   . Smokeless tobacco: None  . Alcohol Use: No   OB History    No data available     Review of Systems  All other systems reviewed and are negative.     Allergies  Review of patient's allergies indicates no known allergies.  Home Medications   Prior to Admission medications   Medication Sig Start Date End Date Taking? Authorizing Provider  ibuprofen (ADVIL,MOTRIN) 200 MG tablet Take 400 mg by mouth every 6 (six) hours as needed for mild pain.   Yes Historical Provider, MD  loratadine (CLARITIN) 10 MG tablet  Take 10 mg by mouth at bedtime.   Yes Historical Provider, MD  albuterol (PROVENTIL HFA;VENTOLIN HFA) 108 (90 Base) MCG/ACT inhaler Inhale 2 puffs into the lungs every 4 (four) hours as needed for wheezing or shortness of breath. 05/02/16   Nelva Nayobert Darriel Sinquefield, MD  predniSONE (DELTASONE) 20 MG tablet Take 2 tablets (40 mg total) by mouth daily. For 5 days Patient not taking: Reported on 03/17/2016 01/09/16   Tammy Triplett, PA-C   BP 132/89 mmHg  Pulse 90  Temp(Src) 98.3 F (36.8 C) (Oral)  Resp 18  Ht 5\' 7"  (1.702 m)  Wt 220 lb (99.791 kg)  BMI 34.45 kg/m2  SpO2 99% Physical Exam  Constitutional: She is oriented to person, place, and time. She appears well-developed and well-nourished. No distress.  HENT:  Head: Normocephalic and atraumatic.  Eyes: Pupils are equal, round, and reactive to light.  Neck: Normal range of motion.  Cardiovascular: Normal rate and intact distal pulses.   Pulmonary/Chest: No respiratory distress. She has wheezes.  Abdominal: Normal appearance. She exhibits no distension. There is no tenderness. There is no rebound.  Musculoskeletal: Normal range of motion.  Neurological: She is alert and oriented to person, place, and  time. No cranial nerve deficit.  Skin: Skin is warm and dry. No rash noted.  Psychiatric: She has a normal mood and affect. Her behavior is normal.  Nursing note and vitals reviewed.   ED Course  Procedures (including critical care time) Medications  ipratropium-albuterol (DUONEB) 0.5-2.5 (3) MG/3ML nebulizer solution 3 mL (3 mLs Nebulization Given 05/02/16 1747)  albuterol (PROVENTIL) (2.5 MG/3ML) 0.083% nebulizer solution 2.5 mg (2.5 mg Nebulization Given 05/02/16 1747)  dexamethasone (DECADRON) injection 10 mg (10 mg Intravenous Given 05/02/16 1834)    Labs Review Labs Reviewed - No data to display   After treatment in the ED the patient feels back to baseline and wants to go home.  MDM   Final diagnoses:  Asthma, unspecified asthma  severity, uncomplicated        Nelva Nay, MD 05/03/16 [phone]

## 2016-05-02 NOTE — ED Notes (Signed)
Pt states understanding of care given and follow up instructions.  Pt ambulated from ED.  No distress noted upon departure

## 2016-05-02 NOTE — Discharge Instructions (Signed)
Asthma, Adult Asthma is a condition of the lungs in which the airways tighten and narrow. Asthma can make it hard to breathe. Asthma cannot be cured, but medicine and lifestyle changes can help control it. Asthma may be started (triggered) by:  Animal skin flakes (dander).  Dust.  Cockroaches.  Pollen.  Mold.  Smoke.  Cleaning products.  Hair sprays or aerosol sprays.  Paint fumes or strong smells.  Cold air, weather changes, and winds.  Crying or laughing hard.  Stress.  Certain medicines or drugs.  Foods, such as dried fruit, potato chips, and sparkling grape juice.  Infections or conditions (colds, flu).  Exercise.  Certain medical conditions or diseases.  Exercise or tiring activities. HOME CARE   Take medicine as told by your doctor.  Use a peak flow meter as told by your doctor. A peak flow meter is a tool that measures how well the lungs are working.  Record and keep track of the peak flow meter's readings.  Understand and use the asthma action plan. An asthma action plan is a written plan for taking care of your asthma and treating your attacks.  To help prevent asthma attacks:  Do not smoke. Stay away from secondhand smoke.  Change your heating and air conditioning filter often.  Limit your use of fireplaces and wood stoves.  Get rid of pests (such as roaches and mice) and their droppings.  Throw away plants if you see mold on them.  Clean your floors. Dust regularly. Use cleaning products that do not smell.  Have someone vacuum when you are not home. Use a vacuum cleaner with a HEPA filter if possible.  Replace carpet with wood, tile, or vinyl flooring. Carpet can trap animal skin flakes and dust.  Use allergy-proof pillows, mattress covers, and box spring covers.  Wash bed sheets and blankets every week in hot water and dry them in a dryer.  Use blankets that are made of polyester or cotton.  Clean bathrooms and kitchens with bleach.  If possible, have someone repaint the walls in these rooms with mold-resistant paint. Keep out of the rooms that are being cleaned and painted.  Wash hands often. GET HELP IF:  You have make a whistling sound when breaking (wheeze), have shortness of breath, or have a cough even if taking medicine to prevent attacks.  The colored mucus you cough up (sputum) is thicker than usual.  The colored mucus you cough up changes from clear or white to yellow, green, gray, or bloody.  You have problems from the medicine you are taking such as:  A rash.  Itching.  Swelling.  Trouble breathing.  You need reliever medicines more than 2-3 times a week.  Your peak flow measurement is still at 50-79% of your personal best after following the action plan for 1 hour.  You have a fever. GET HELP RIGHT AWAY IF:   You seem to be worse and are not responding to medicine during an asthma attack.  You are short of breath even at rest.  You get short of breath when doing very little activity.  You have trouble eating, drinking, or talking.  You have chest pain.  You have a fast heartbeat.  Your lips or fingernails start to turn blue.  You are light-headed, dizzy, or faint.  Your peak flow is less than 50% of your personal best.   This information is not intended to replace advice given to you by your health care provider. Make sure   you discuss any questions you have with your health care provider.   Document Released: 05/17/2008 Document Revised: 08/20/2015 Document Reviewed: 06/28/2013 Elsevier Interactive Patient Education 2016 [employer]  

## 2016-05-02 NOTE — ED Notes (Signed)
Pt states she is out of her inhaler and having an asthma flare.  Audible wheezing at triage.

## 2016-07-21 ENCOUNTER — Encounter (HOSPITAL_COMMUNITY): Payer: Self-pay | Admitting: *Deleted

## 2016-07-21 ENCOUNTER — Emergency Department (HOSPITAL_COMMUNITY)
Admission: EM | Admit: 2016-07-21 | Discharge: 2016-07-22 | Disposition: A | Discharge status: Home / Self Care | Payer: Self-pay | Attending: Emergency Medicine | Admitting: Emergency Medicine

## 2016-07-21 DIAGNOSIS — Z79899 Other long term (current) drug therapy: Secondary | ICD-10-CM | POA: Insufficient documentation

## 2016-07-21 DIAGNOSIS — J45901 Unspecified asthma with (acute) exacerbation: Secondary | ICD-10-CM | POA: Insufficient documentation

## 2016-07-21 DIAGNOSIS — Z791 Long term (current) use of non-steroidal anti-inflammatories (NSAID): Secondary | ICD-10-CM | POA: Insufficient documentation

## 2016-07-21 MED ORDER — IPRATROPIUM-ALBUTEROL 0.5-2.5 (3) MG/3ML IN SOLN
3.0000 mL | Freq: Once | RESPIRATORY_TRACT | Status: AC
  Administered 2016-07-21: 3 mL via RESPIRATORY_TRACT
  Filled 2016-07-21: qty 3

## 2016-07-21 NOTE — ED Triage Notes (Signed)
Pt states that she has been   breathing fumes from where the tobacco field across the road from her was sprayed today and ran out of her inhaler yesterday,

## 2016-07-21 NOTE — ED Notes (Signed)
resp called for breathing tx,
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
## 2016-07-22 MED ORDER — DEXAMETHASONE 4 MG PO TABS
12.0000 mg | ORAL_TABLET | Freq: Once | ORAL | Status: AC
  Administered 2016-07-22: 12 mg via ORAL
  Filled 2016-07-22: qty 3

## 2016-07-22 MED ORDER — ALBUTEROL SULFATE HFA 108 (90 BASE) MCG/ACT IN AERS
2.0000 | INHALATION_SPRAY | RESPIRATORY_TRACT | Status: DC | PRN
  Administered 2016-07-22: 2 via RESPIRATORY_TRACT
  Filled 2016-07-22: qty 6.7

## 2016-11-09 ENCOUNTER — Emergency Department (HOSPITAL_COMMUNITY)
Admission: EM | Admit: 2016-11-09 | Discharge: 2016-11-09 | Disposition: A | Discharge status: Home / Self Care | Payer: Self-pay | Attending: Emergency Medicine | Admitting: Emergency Medicine

## 2016-11-09 ENCOUNTER — Encounter (HOSPITAL_COMMUNITY): Payer: Self-pay | Admitting: Emergency Medicine

## 2016-11-09 DIAGNOSIS — Z79899 Other long term (current) drug therapy: Secondary | ICD-10-CM | POA: Insufficient documentation

## 2016-11-09 DIAGNOSIS — J4521 Mild intermittent asthma with (acute) exacerbation: Secondary | ICD-10-CM | POA: Insufficient documentation

## 2016-11-09 DIAGNOSIS — Z791 Long term (current) use of non-steroidal anti-inflammatories (NSAID): Secondary | ICD-10-CM | POA: Insufficient documentation

## 2016-11-09 MED ORDER — PREDNISONE 20 MG PO TABS
40.0000 mg | ORAL_TABLET | Freq: Once | ORAL | Status: AC
  Administered 2016-11-09: 40 mg via ORAL
  Filled 2016-11-09: qty 2

## 2016-11-09 MED ORDER — ALBUTEROL SULFATE HFA 108 (90 BASE) MCG/ACT IN AERS
2.0000 | INHALATION_SPRAY | Freq: Once | RESPIRATORY_TRACT | Status: AC
  Administered 2016-11-09: 2 via RESPIRATORY_TRACT
  Filled 2016-11-09: qty 6.7

## 2016-11-09 MED ORDER — DEXAMETHASONE 4 MG PO TABS: 4.0000 mg | ORAL_TABLET | Freq: Two times a day (BID) | ORAL | 0 refills | Status: DC

## 2016-11-09 NOTE — Discharge Instructions (Signed)
Please use albuterol 2 puffs every 4 hours. Please use Decadron 2 times daily with food. Increase fluids. Please see your primary physician or return to the emergency department if any changes, problems, or concerns.

## 2016-11-09 NOTE — ED Triage Notes (Signed)
Patient states she had an asthma attack this morning and ran out of her inhaler last night. NAD

## 2016-11-09 NOTE — ED Provider Notes (Signed)
[facility] Provider Note   CSN: [id number] Arrival date & time: 11/09/16  [id number]     History   Chief Complaint Chief Complaint  Patient presents with  . Cough  . Asthma    HPI Bailey Mcdonald is a 45 y.o. female.  The history is provided by the patient.  Cough  This is a new problem. The current episode started 6 to 12 hours ago. The problem occurs hourly. The problem has been gradually worsening. The cough is non-productive. There has been no fever. Associated symptoms include rhinorrhea, myalgias and wheezing. Pertinent negatives include no sweats and no sore throat. She has tried nothing (Pt ran out of albuterol inhaler) for the symptoms. She is not a smoker. Her past medical history is significant for asthma.    Past Medical History:  Diagnosis Date  . Anemia   . Arthritis   . Asthma   . Hypoglycemia   . IBS (irritable bowel syndrome)     Patient Active Problem List   Diagnosis Date Noted  . 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 S/P arthroscopy of right knee 06/28/2012  . 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 S/P arthroscopy of left knee 10/20/2011  . Chondromalacia of patella, right 05/19/2011  . 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 KNEE PAIN 01/13/2010  . ASTHMA 12/18/2009    Past Surgical History:  Procedure Laterality Date  . ABDOMINAL HYSTERECTOMY    . CHONDROPLASTY  09/24/2011   Procedure: CHONDROPLASTY;  Surgeon: Fuller CanadaStanley Harrison, MD;  Location: [facility];  Service: Orthopedics;  Laterality: Left;  Chondroplasty Patella  . KNEE ARTHROSCOPY  09/24/2011   Procedure: ARTHROSCOPY KNEE;  Surgeon: Fuller CanadaStanley Harrison, MD;  Location: [facility];  Service: Orthopedics;  Laterality: Left;  . KNEE SURGERY  05/2011   rtight knee scope chondroplasty patella  . MOUTH SURGERY     removal of teeth  . TUBAL LIGATION      OB History    No data available       Home Medications    Prior to Admission medications   Medication Sig Start Date End Date Taking?  Authorizing Provider  cetirizine (ZYRTEC) 10 MG tablet Take 10 mg by mouth daily.   Yes Historical Provider, MD  albuterol (PROVENTIL HFA;VENTOLIN HFA) 108 (90 Base) MCG/ACT inhaler Inhale 2 puffs into the lungs every 4 (four) hours as needed for wheezing or shortness of breath. Patient not taking: Reported on 11/09/2016 05/02/16   Nelva Nayobert Beaton, MD  ibuprofen (ADVIL,MOTRIN) 200 MG tablet Take 400 mg by mouth every 6 (six) hours as needed for mild pain.    Historical Provider, MD    Family History Family History  Problem Relation Age of Onset  . Heart disease    . Arthritis    . Cancer    . Asthma    . Diabetes    . Anesthesia problems Neg Hx   . Hypotension Neg Hx   . Malignant hyperthermia Neg Hx   . Pseudochol deficiency Neg Hx     Social History Social History  Substance Use Topics  . Smoking status: Never Smoker  . Smokeless tobacco: Never Used  . Alcohol use No     Allergies   Patient has no known allergies.   Review of Systems Review of Systems  HENT: Positive for rhinorrhea. Negative for sore throat.   Respiratory: Positive for cough and wheezing.   Musculoskeletal: Positive for myalgias.  All other systems reviewed and are negative.    Physical Exam Updated Vital Signs  BP 137/85   Pulse 87   Temp 98.1 F (36.7 C) (Oral)   Resp 22   Ht 5\' 7"  (1.702 m)   Wt 102.1 kg   SpO2 97%   BMI 35.24 kg/m   Physical Exam  Constitutional: She is oriented to person, place, and time. She appears well-developed and well-nourished.  Non-toxic appearance.  HENT:  Head: Normocephalic.  Right Ear: Tympanic membrane and external ear normal.  Left Ear: Tympanic membrane and external ear normal.  Eyes: EOM and lids are normal. Pupils are equal, round, and reactive to light.  Neck: Normal range of motion. Neck supple. Carotid bruit is not present.  Cardiovascular: Normal rate, regular rhythm, normal heart sounds, intact distal pulses and normal pulses.     Pulmonary/Chest: Breath sounds normal. No respiratory distress.  Course breath sound present. Few scattered wheezes present. Symmetrical rise and fall of the chest. Pt speaks in complete sentences.  Abdominal: Soft. Bowel sounds are normal. There is no tenderness. There is no guarding.  Musculoskeletal: Normal range of motion.  Lymphadenopathy:       Head (right side): No submandibular adenopathy present.       Head (left side): No submandibular adenopathy present.    She has no cervical adenopathy.  Neurological: She is alert and oriented to person, place, and time. She has normal strength. No cranial nerve deficit or sensory deficit.  Skin: Skin is warm and dry.  Psychiatric: She has a normal mood and affect. Her speech is normal.  Nursing note and vitals reviewed.    ED Treatments / Results  Labs (all labs ordered are listed, but only abnormal results are displayed) Labs Reviewed - No data to display  EKG  EKG Interpretation None       Radiology No results found.  Procedures Procedures (including critical care time)  Medications Ordered in ED Medications  albuterol (PROVENTIL HFA;VENTOLIN HFA) 108 (90 Base) MCG/ACT inhaler 2 puff (not administered)  predniSONE (DELTASONE) tablet 40 mg (not administered)     Initial Impression / Assessment and Plan / ED Course  I have reviewed the triage vital signs and the nursing notes.  Pertinent labs & imaging results that were available during my care of the patient were reviewed by me and considered in my medical decision making (see chart for details).  Clinical Course     *I have reviewed nursing notes, vital signs, and all appropriate lab and imaging results for this patient.**  Final Clinical Impressions(s) / ED Diagnoses  Pulse oximetry is 99% on room air. Vital signs within normal limits. Patient states she is fighting an upper respiratory infection. Her peripheral inhaler ran out, and she had an asthma attack this  morning. The patient will be treated with albuterol and steroid. She is to follow with her primary physician for additional evaluation and management. She will return to the emergency department sooner if any changes, problems, or concerns. Patient is in agreement with this discharge plan.    Final diagnoses:  Mild intermittent asthma with exacerbation    New Prescriptions New Prescriptions   No medications on file     Ivery QualeHobson Shaily Librizzi, PA-C 11/10/16 1052    Samuel JesterKathleen McManus, DO 11/18/16 1511

## 2016-12-19 ENCOUNTER — Emergency Department (HOSPITAL_COMMUNITY): Payer: Self-pay

## 2016-12-19 ENCOUNTER — Encounter (HOSPITAL_COMMUNITY): Payer: Self-pay

## 2016-12-19 ENCOUNTER — Emergency Department (HOSPITAL_COMMUNITY)
Admission: EM | Admit: 2016-12-19 | Discharge: 2016-12-19 | Disposition: A | Discharge status: Home / Self Care | Payer: Self-pay | Attending: Emergency Medicine | Admitting: Emergency Medicine

## 2016-12-19 DIAGNOSIS — Z791 Long term (current) use of non-steroidal anti-inflammatories (NSAID): Secondary | ICD-10-CM | POA: Insufficient documentation

## 2016-12-19 DIAGNOSIS — J4521 Mild intermittent asthma with (acute) exacerbation: Secondary | ICD-10-CM | POA: Insufficient documentation

## 2016-12-19 LAB — BASIC METABOLIC PANEL
Anion gap: 9 (ref 5–15)
BUN: 9 mg/dL (ref 6–20)
CHLORIDE: 107 mmol/L (ref 101–111)
CO2: 25 mmol/L (ref 22–32)
Calcium: 9.7 mg/dL (ref 8.9–10.3)
Creatinine, Ser: 0.82 mg/dL (ref 0.44–1.00)
Glucose, Bld: 116 mg/dL — ABNORMAL HIGH (ref 65–99)
POTASSIUM: 3.8 mmol/L (ref 3.5–5.1)
SODIUM: 141 mmol/L (ref 135–145)

## 2016-12-19 LAB — CBC WITH DIFFERENTIAL/PLATELET
BASOS ABS: 0.1 10*3/uL (ref 0.0–0.1)
BASOS PCT: 0 %
Eosinophils Absolute: 0.5 10*3/uL (ref 0.0–0.7)
Eosinophils Relative: 4 %
HCT: 43.5 % (ref 36.0–46.0)
HEMOGLOBIN: 14.5 g/dL (ref 12.0–15.0)
Lymphocytes Relative: 17 %
Lymphs Abs: 1.9 10*3/uL (ref 0.7–4.0)
MCH: 30 pg (ref 26.0–34.0)
MCHC: 33.3 g/dL (ref 30.0–36.0)
MCV: 90.1 fL (ref 78.0–100.0)
Monocytes Absolute: 0.5 10*3/uL (ref 0.1–1.0)
Monocytes Relative: 4 %
NEUTROS PCT: 75 %
Neutro Abs: 8.7 10*3/uL — ABNORMAL HIGH (ref 1.7–7.7)
Platelets: 274 10*3/uL (ref 150–400)
RBC: 4.83 MIL/uL (ref 3.87–5.11)
RDW: 12.5 % (ref 11.5–15.5)
WBC: 11.6 10*3/uL — AB (ref 4.0–10.5)

## 2016-12-19 MED ORDER — PREDNISONE 10 MG (21) PO TBPK: ORAL_TABLET | ORAL | 0 refills | Status: DC

## 2016-12-19 MED ORDER — ALBUTEROL SULFATE HFA 108 (90 BASE) MCG/ACT IN AERS
1.0000 | INHALATION_SPRAY | RESPIRATORY_TRACT | Status: DC | PRN
  Filled 2016-12-19: qty 6.7

## 2016-12-19 MED ORDER — METHYLPREDNISOLONE SODIUM SUCC 125 MG IJ SOLR
125.0000 mg | Freq: Once | INTRAMUSCULAR | Status: AC
  Administered 2016-12-19: 125 mg via INTRAVENOUS
  Filled 2016-12-19: qty 2

## 2016-12-19 MED ORDER — ALBUTEROL (5 MG/ML) CONTINUOUS INHALATION SOLN
10.0000 mg/h | INHALATION_SOLUTION | Freq: Once | RESPIRATORY_TRACT | Status: AC
  Administered 2016-12-19: 10 mg/h via RESPIRATORY_TRACT
  Filled 2016-12-19: qty 20

## 2016-12-19 MED ORDER — IPRATROPIUM BROMIDE 0.02 % IN SOLN
RESPIRATORY_TRACT | Status: AC
  Administered 2016-12-19: 1 mg
  Filled 2016-12-19: qty 5

## 2016-12-19 NOTE — ED Notes (Signed)
Pt is still getting her continuous ned. Per respiratory, pt has about 10 minutes left with her treatment.

## 2016-12-19 NOTE — ED Triage Notes (Signed)
I have a history of Asthma and I have been using my inhaler and it is not helping much.  I have been coughing since this morning.  The shortness of breath started today.

## 2016-12-19 NOTE — ED Provider Notes (Signed)
[facility] Provider Note   CSN: [id number] Arrival date & time: 12/19/16  1958     History   Chief Complaint Chief Complaint  Patient presents with  . Shortness of Breath    HPI Bailey Mcdonald is a 46 y.o. female.  Pt presents to the Ed today with sob.  She said that she has a hx of asthma and has an inhaler.  She has been using it all day, and it has not been helping.      Past Medical History:  Diagnosis Date  . Anemia   . Arthritis   . Asthma   . Hypoglycemia   . IBS (irritable bowel syndrome)     Patient Active Problem List   Diagnosis Date Noted  . 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 S/P arthroscopy of right knee 06/28/2012  . 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 S/P arthroscopy of left knee 10/20/2011  . Chondromalacia of patella, right 05/19/2011  . 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 KNEE PAIN 01/13/2010  . ASTHMA 12/18/2009    Past Surgical History:  Procedure Laterality Date  . ABDOMINAL HYSTERECTOMY    . CHONDROPLASTY  09/24/2011   Procedure: CHONDROPLASTY;  Surgeon: [name], MD;  Location: [facility];  Service: Orthopedics;  Laterality: Left;  Chondroplasty Patella  . KNEE ARTHROSCOPY  09/24/2011   Procedure: ARTHROSCOPY KNEE;  Surgeon: [name], MD;  Location: [facility];  Service: Orthopedics;  Laterality: Left;  . KNEE SURGERY  05/2011   rtight knee scope chondroplasty patella  . MOUTH SURGERY     removal of teeth  . TUBAL LIGATION      OB History    No data available       Home Medications    Prior to Admission medications   Medication Sig Start Date End Date Taking? Authorizing Provider  albuterol (PROVENTIL HFA;VENTOLIN HFA) 108 (90 Base) MCG/ACT inhaler Inhale 2 puffs into the lungs every 4 (four) hours as needed for wheezing or shortness of breath. 05/02/16  Yes Nelva Nay, MD  cetirizine (ZYRTEC) 10 MG tablet Take 10 mg by mouth at bedtime.    Yes Historical Provider, MD  ibuprofen (ADVIL,MOTRIN) 200 MG  tablet Take 400 mg by mouth every 6 (six) hours as needed for mild pain.   Yes Historical Provider, MD  dexamethasone (DECADRON) 4 MG tablet Take 1 tablet (4 mg total) by mouth 2 (two) times daily with a meal. Patient not taking: Reported on 12/19/2016 11/09/16   Ivery Quale, PA-C  predniSONE (STERAPRED UNI-PAK 21 TAB) 10 MG (21) TBPK tablet Take 6 tabs by mouth daily  for 2 days, then 5 tabs for 2 days, then 4 tabs for 2 days, then 3 tabs for 2 days, 2 tabs for 2 days, then 1 tab by mouth daily for 2 days 12/19/16   Jacalyn Lefevre, MD    Family History Family History  Problem Relation Age of Onset  . Heart disease    . Arthritis    . Cancer    . Asthma    . Diabetes    . Anesthesia problems Neg Hx   . Hypotension Neg Hx   . Malignant hyperthermia Neg Hx   . Pseudochol deficiency Neg Hx     Social History Social History  Substance Use Topics  . Smoking status: Never Smoker  . Smokeless tobacco: Never Used  . Alcohol use No     Allergies   Patient has no known allergies.   Review of Systems Review of  Systems  Respiratory: Positive for cough, shortness of breath and wheezing.   All other systems reviewed and are negative.    Physical Exam Updated Vital Signs BP 140/88   Pulse 115   Temp 98.4 F (36.9 C) (Oral)   Resp 15   Ht 5\' 8"  (1.727 m)   Wt 215 lb (97.5 kg)   SpO2 100%   BMI 32.69 kg/m   Physical Exam  Constitutional: She is oriented to person, place, and time. She appears well-developed and well-nourished.  HENT:  Head: Normocephalic and atraumatic.  Right Ear: External ear normal.  Left Ear: External ear normal.  Nose: Nose normal.  Mouth/Throat: Oropharynx is clear and moist.  Eyes: Conjunctivae and EOM are normal. Pupils are equal, round, and reactive to light.  Neck: Normal range of motion. Neck supple.  Cardiovascular: Tachycardia present.   Pulmonary/Chest: Tachypnea noted. She has wheezes.  Abdominal: Soft.  Musculoskeletal: Normal range of  motion.  Neurological: She is alert and oriented to person, place, and time.  Psychiatric: She has a normal mood and affect.  Nursing note and vitals reviewed.    ED Treatments / Results  Labs (all labs ordered are listed, but only abnormal results are displayed) Labs Reviewed  CBC WITH DIFFERENTIAL/PLATELET - Abnormal; Notable for the following:       Result Value   WBC 11.6 (*)    Neutro Abs 8.7 (*)    All other components within normal limits  BASIC METABOLIC PANEL - Abnormal; Notable for the following:    Glucose, Bld 116 (*)    All other components within normal limits    EKG  EKG Interpretation  Date/Time:  Sunday December 19 2016 20:13:00 EST Ventricular Rate:  114 PR Interval:    QRS Duration: 85 QT Interval:  333 QTC Calculation: 459 R Axis:   -13 Text Interpretation:  Sinus tachycardia Low voltage, precordial leads Confirmed by Valorie Mcgrory MD, Chasey Dull ([id number]) on 12/19/2016 8:15:01 PM       Radiology Dg Chest Port 1 View  Result Date: 12/19/2016 CLINICAL DATA:  Acute onset of shortness of breath and cough. EXAM: PORTABLE CHEST 1 VIEW COMPARISON:  Chest radiograph performed 03/17/2016 FINDINGS: The lungs are well-aerated and clear. There is no evidence of focal opacification, pleural effusion or pneumothorax. The cardiomediastinal silhouette is mildly enlarged. No acute osseous abnormalities are seen. IMPRESSION: Mild cardiomegaly.  Lungs remain grossly clear. Electronically Signed   By: Roanna Raider M.D.   On: 12/19/2016 20:31    Procedures Procedures (including critical care time)  Medications Ordered in ED Medications  albuterol (PROVENTIL,VENTOLIN) solution continuous neb (10 mg/hr Nebulization Given 12/19/16 2030)  albuterol (PROVENTIL HFA;VENTOLIN HFA) 108 (90 Base) MCG/ACT inhaler 1-2 puff (not administered)  methylPREDNISolone sodium succinate (SOLU-MEDROL) 125 mg/2 mL injection 125 mg (125 mg Intravenous Given 12/19/16 2027)  ipratropium (ATROVENT) 0.02 %  nebulizer solution (1 mg  Given 12/19/16 2030)     Initial Impression / Assessment and Plan / ED Course  I have reviewed the triage vital signs and the nursing notes.  Pertinent labs & imaging results that were available during my care of the patient were reviewed by me and considered in my medical decision making (see chart for details).  Clinical Course     Pt feels much better after the nebs and steroids.  She is given an albuterol inhaler here as hers is almost empty.  She has a spacer.  She is given a rx for prednisone.  She knows to return  if worse.  Final Clinical Impressions(s) / ED Diagnoses   Final diagnoses:  Mild intermittent asthma with exacerbation    New Prescriptions New Prescriptions   PREDNISONE (STERAPRED UNI-PAK 21 TAB) 10 MG (21) TBPK TABLET    Take 6 tabs by mouth daily  for 2 days, then 5 tabs for 2 days, then 4 tabs for 2 days, then 3 tabs for 2 days, 2 tabs for 2 days, then 1 tab by mouth daily for 2 days     Jacalyn LefevreJulie Liberty Seto, MD 12/19/16 2117

## 2017-08-27 ENCOUNTER — Emergency Department (HOSPITAL_COMMUNITY): Payer: Self-pay

## 2017-08-27 ENCOUNTER — Encounter (HOSPITAL_COMMUNITY): Payer: Self-pay | Admitting: Emergency Medicine

## 2017-08-27 ENCOUNTER — Emergency Department (HOSPITAL_COMMUNITY)
Admission: EM | Admit: 2017-08-27 | Discharge: 2017-08-27 | Disposition: A | Discharge status: Home / Self Care | Payer: Self-pay | Attending: Emergency Medicine | Admitting: Emergency Medicine

## 2017-08-27 DIAGNOSIS — S46912A Strain of unspecified muscle, fascia and tendon at shoulder and upper arm level, left arm, initial encounter: Secondary | ICD-10-CM | POA: Insufficient documentation

## 2017-08-27 DIAGNOSIS — J45909 Unspecified asthma, uncomplicated: Secondary | ICD-10-CM | POA: Insufficient documentation

## 2017-08-27 DIAGNOSIS — Y929 Unspecified place or not applicable: Secondary | ICD-10-CM | POA: Insufficient documentation

## 2017-08-27 DIAGNOSIS — W010XXA Fall on same level from slipping, tripping and stumbling without subsequent striking against object, initial encounter: Secondary | ICD-10-CM | POA: Insufficient documentation

## 2017-08-27 DIAGNOSIS — Y939 Activity, unspecified: Secondary | ICD-10-CM | POA: Insufficient documentation

## 2017-08-27 DIAGNOSIS — Y999 Unspecified external cause status: Secondary | ICD-10-CM | POA: Insufficient documentation

## 2017-08-27 DIAGNOSIS — Z79899 Other long term (current) drug therapy: Secondary | ICD-10-CM | POA: Insufficient documentation

## 2017-08-27 MED ORDER — CYCLOBENZAPRINE HCL 10 MG PO TABS: 10.0000 mg | ORAL_TABLET | Freq: Three times a day (TID) | ORAL | 0 refills | Status: DC

## 2017-08-27 MED ORDER — IBUPROFEN 600 MG PO TABS: 600.0000 mg | ORAL_TABLET | Freq: Four times a day (QID) | ORAL | 0 refills | Status: DC

## 2017-08-27 NOTE — ED Provider Notes (Signed)
[facility] Provider Note   CSN: [id number] Arrival date & time: 08/27/17  1713     History   Chief Complaint Chief Complaint  Patient presents with  . Shoulder Pain    HPI Bailey Mcdonald is a 46 y.o. female.  Patient is a 46 year old female who presents to the emergency department with a complaint of left shoulder pain.  The patient states that she fell approximately a week ago from a standing position. She landed primarily on her shoulder. She sustained injury to her wrist and knee, but she states over the weeks. At time these improved. She has pain when she lifts her arm above her head. She has pain with certain movements. She has pain with deep breathing under the shoulder blade area. No previous operations or procedures involving the shoulder. It is of note that she has been diagnosed in the past with arthritis.   The history is provided by the patient.  Shoulder Pain      Past Medical History:  Diagnosis Date  . Anemia   . Arthritis   . Asthma   . Hypoglycemia   . IBS (irritable bowel syndrome)     Patient Active Problem List   Diagnosis Date Noted  . 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 S/P arthroscopy of right knee 06/28/2012  . 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 S/P arthroscopy of left knee 10/20/2011  . Chondromalacia of patella, right 05/19/2011  . 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 KNEE PAIN 01/13/2010  . ASTHMA 12/18/2009    Past Surgical History:  Procedure Laterality Date  . ABDOMINAL HYSTERECTOMY    . CHONDROPLASTY  09/24/2011   Procedure: CHONDROPLASTY;  Surgeon: [name], MD;  Location: [facility];  Service: Orthopedics;  Laterality: Left;  Chondroplasty Patella  . KNEE ARTHROSCOPY  09/24/2011   Procedure: ARTHROSCOPY KNEE;  Surgeon: [name], MD;  Location: [facility];  Service: Orthopedics;  Laterality: Left;  . KNEE SURGERY  05/2011   rtight knee scope chondroplasty patella  . MOUTH SURGERY     removal of teeth  .  TUBAL LIGATION      OB History    Gravida Para Term Preterm AB Living   4         4   SAB TAB Ectopic Multiple Live Births                   Home Medications    Prior to Admission medications   Medication Sig Start Date End Date Taking? Authorizing Provider  albuterol (PROVENTIL HFA;VENTOLIN HFA) 108 (90 Base) MCG/ACT inhaler Inhale 2 puffs into the lungs every 4 (four) hours as needed for wheezing or shortness of breath. 05/02/16   Nelva Nay, MD  cetirizine (ZYRTEC) 10 MG tablet Take 10 mg by mouth at bedtime.     [provider]  dexamethasone (DECADRON) 4 MG tablet Take 1 tablet (4 mg total) by mouth 2 (two) times daily with a meal. Patient not taking: Reported on 12/19/2016 11/09/16   Ivery Quale, PA-C  ibuprofen (ADVIL,MOTRIN) 200 MG tablet Take 400 mg by mouth every 6 (six) hours as needed for mild pain.    [provider]  predniSONE (STERAPRED UNI-PAK 21 TAB) 10 MG (21) TBPK tablet Take 6 tabs by mouth daily  for 2 days, then 5 tabs for 2 days, then 4 tabs for 2 days, then 3 tabs for 2 days, 2 tabs for 2 days, then 1 tab by mouth daily for 2 days 12/19/16  Jacalyn Lefevre, MD    Family History Family History  Problem Relation Age of Onset  . Heart disease Unknown   . Arthritis Unknown   . Cancer Unknown   . Asthma Unknown   . Diabetes Unknown   . Anesthesia problems Neg Hx   . Hypotension Neg Hx   . Malignant hyperthermia Neg Hx   . Pseudochol deficiency Neg Hx     Social History Social History  Substance Use Topics  . Smoking status: Never Smoker  . Smokeless tobacco: Never Used  . Alcohol use No     Allergies   Patient has no known allergies.   Review of Systems Review of Systems  Constitutional: Negative for activity change.       All ROS Neg except as noted in HPI  HENT: Negative for nosebleeds.   Eyes: Negative for photophobia and discharge.  Respiratory: Negative for cough, shortness of breath and wheezing.     Cardiovascular: Negative for chest pain and palpitations.  Gastrointestinal: Negative for abdominal pain and blood in stool.  Genitourinary: Negative for dysuria, frequency and hematuria.  Musculoskeletal: Positive for arthralgias. Negative for back pain and neck pain.  Skin: Negative.   Neurological: Negative for dizziness, seizures and speech difficulty.  Psychiatric/Behavioral: Negative for confusion and hallucinations.     Physical Exam Updated Vital Signs BP (!) 142/79 (BP Location: Right Arm)   Pulse 87   Temp 98.1 F (36.7 C) (Oral)   Resp 18   Ht  (1.702 m)   Wt 99.8 kg (220 lb)   SpO2 100%   BMI 34.46 kg/m   Physical Exam  Constitutional: She is oriented to person, place, and time. She appears well-developed and well-nourished.  Non-toxic appearance.  HENT:  Head: Normocephalic.  Right Ear: Tympanic membrane and external ear normal.  Left Ear: Tympanic membrane and external ear normal.  Eyes: Pupils are equal, round, and reactive to light. EOM and lids are normal.  Neck: Normal range of motion. Neck supple. Carotid bruit is not present.  Cardiovascular: Normal rate, regular rhythm, normal heart sounds, intact distal pulses and normal pulses.   Pulmonary/Chest: Breath sounds normal. No respiratory distress.  Abdominal: Soft. Bowel sounds are normal. There is no tenderness. There is no guarding.  Musculoskeletal:       Left shoulder: She exhibits decreased range of motion, tenderness and crepitus. She exhibits no deformity.  Radial pulses 2+ on the left. Capillary refill is less than 2 seconds. Full range of motion of fingers, wrist, and elbow. Pain with attempted range of motion of the left shoulder.  Lymphadenopathy:       Head (right side): No submandibular adenopathy present.       Head (left side): No submandibular adenopathy present.    She has no cervical adenopathy.  Neurological: She is alert and oriented to person, place, and time. She has normal  strength. No cranial nerve deficit or sensory deficit.  Skin: Skin is warm and dry.  Psychiatric: She has a normal mood and affect. Her speech is normal.  Nursing note and vitals reviewed.    ED Treatments / Results  Labs (all labs ordered are listed, but only abnormal results are displayed) Labs Reviewed - No data to display  EKG  EKG Interpretation None       Radiology Dg Shoulder Left  Result Date: 08/27/2017 CLINICAL DATA:  Pain after fall 1 week ago. EXAM: LEFT SHOULDER - 2+ VIEW COMPARISON:  None. FINDINGS: There is no evidence  of fracture or dislocation. There is no evidence of arthropathy or other focal bone abnormality. Soft tissues are unremarkable. IMPRESSION: Negative. Electronically Signed   By: Gerome Sam III M.D   On: 08/27/2017 17:53    Procedures Procedures (including critical care time)  Medications Ordered in ED Medications - No data to display   Initial Impression / Assessment and Plan / ED Course  I have reviewed the triage vital signs and the nursing notes.  Pertinent labs & imaging results that were available during my care of the patient were reviewed by me and considered in my medical decision making (see chart for details).      Final Clinical Impressions(s) / ED Diagnoses MDM Vital signs within normal limits. Pain is reproducible with attempted range of motion. Patient has posterior pain at the scapula area. Also some pain near the axilla on. The left shoulder is not hot, and is no deformity of the left shoulder.  X-ray of the left shoulder is negative for fracture or dislocation. I suspect that the patient has shoulder strain on. I've asked the patient to use a heating pad, or warm tub soaks. The patient is given a prescription for Flexeril and anti-inflammatory pain medication. The patient is to see Dr. Romeo Apple for orthopedic evaluation if not improving.    Final diagnoses:  Strain of left shoulder, initial encounter    New  Prescriptions New Prescriptions   No medications on file     Ivery Quale, Cordelia Poche 08/27/17 1851    Bethann Berkshire, MD 08/27/17 [phone]

## 2017-08-27 NOTE — Discharge Instructions (Signed)
Your x-ray is negative for fracture or dislocation. There no acute vascular or neurologic deficits appreciated. Please use a heating pad to your shoulder, or use warm tub soaks to your shoulder daily. Use ibuprofen with breakfast, lunch, dinner, and at bedtime. Use Flexeril 3 times daily for spasm pain. Flexeril may cause drowsiness, and/or lightheadedness. Please do not drive a vehicle, operating machinery, drink alcohol, or participate in activities requiring concentration when taking this medication. Please see Dr. Romeo Apple for orthopedic management if this is not improving.

## 2017-08-27 NOTE — ED Triage Notes (Signed)
Patient c/o left shoulder[place] Patient states pain worse with moving, deep breath, and raising arm. Patient reports using heating pad and taking ibuprofen with no relief-last dose of ibuprofen this morning at 9am.

## 2018-01-04 IMAGING — DX DG SHOULDER 2+V*L*
3 series · 3 of 3 positions shown · non-contrast
Comparison: None.

CLINICAL DATA: Pain after fall 1 week ago.

EXAM:
LEFT SHOULDER - 2+ VIEW

[shoulder grashey]
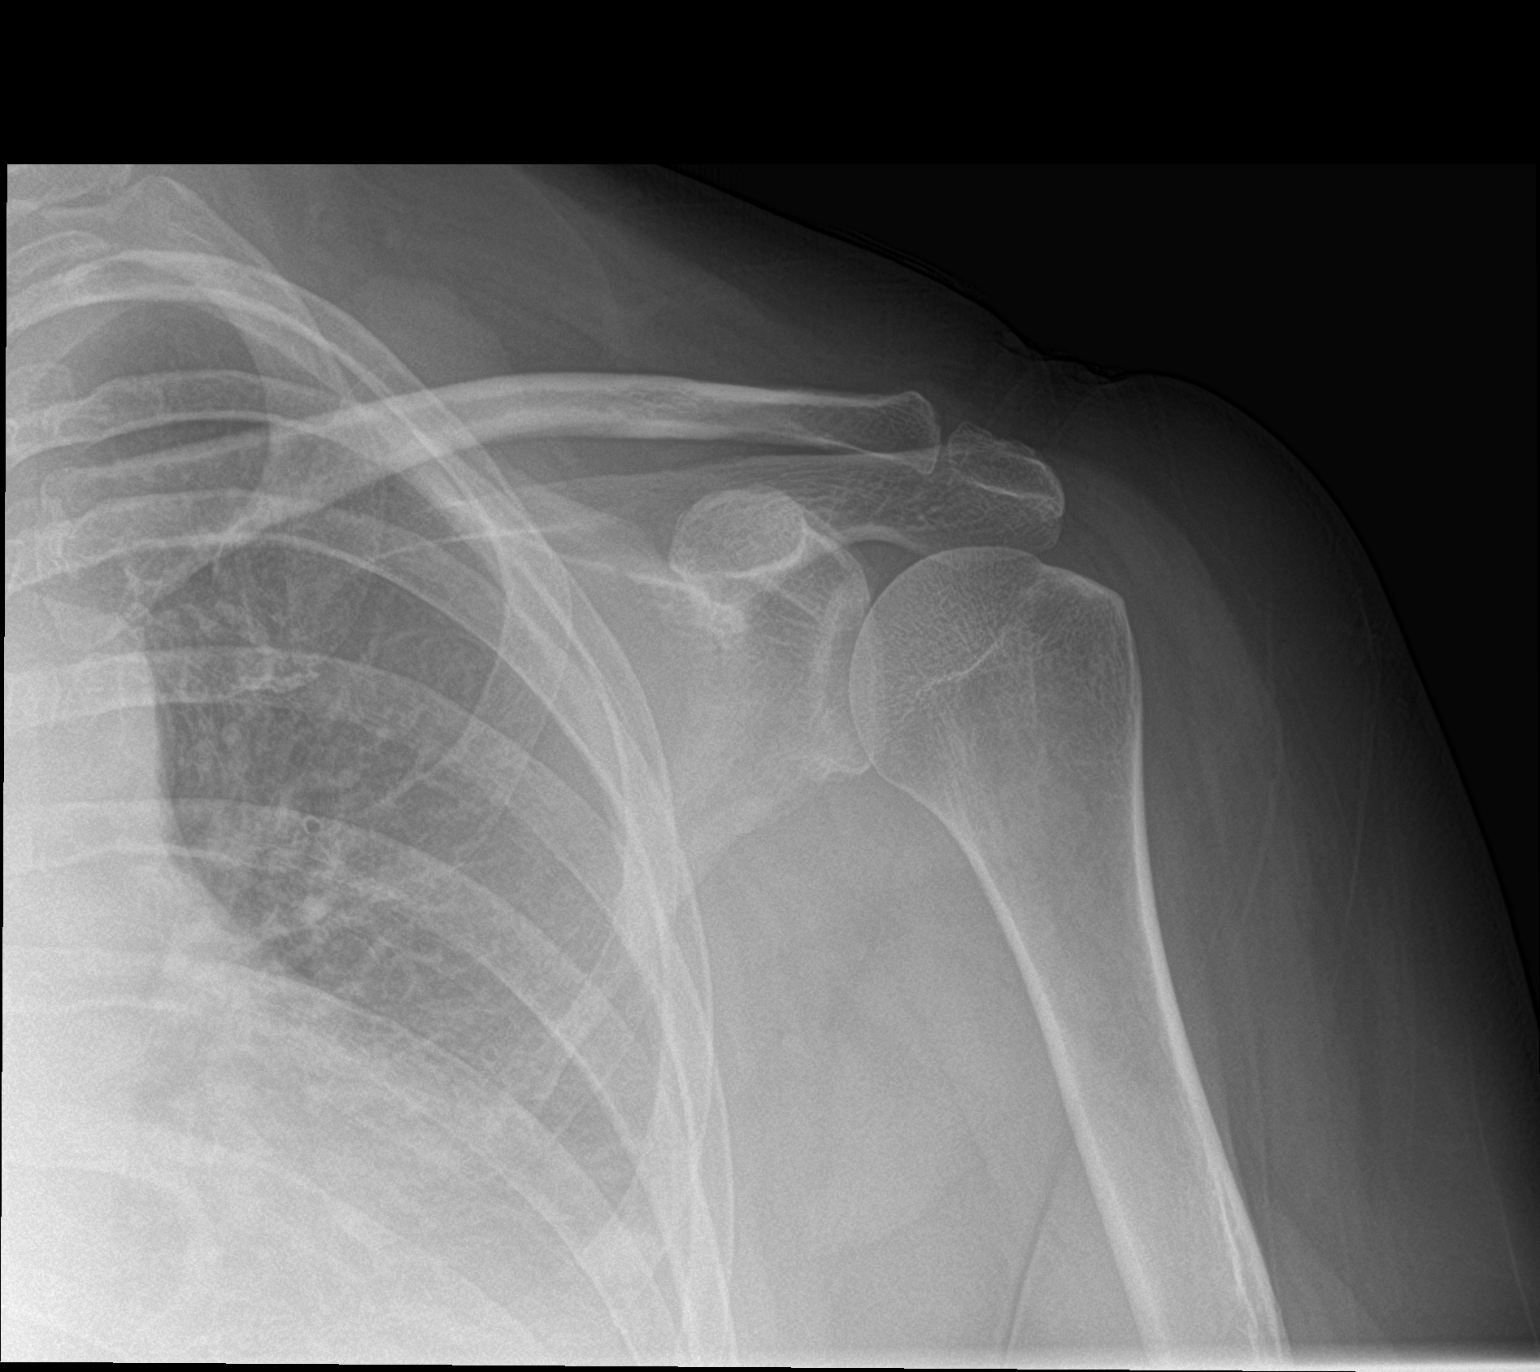

[shoulder y view]
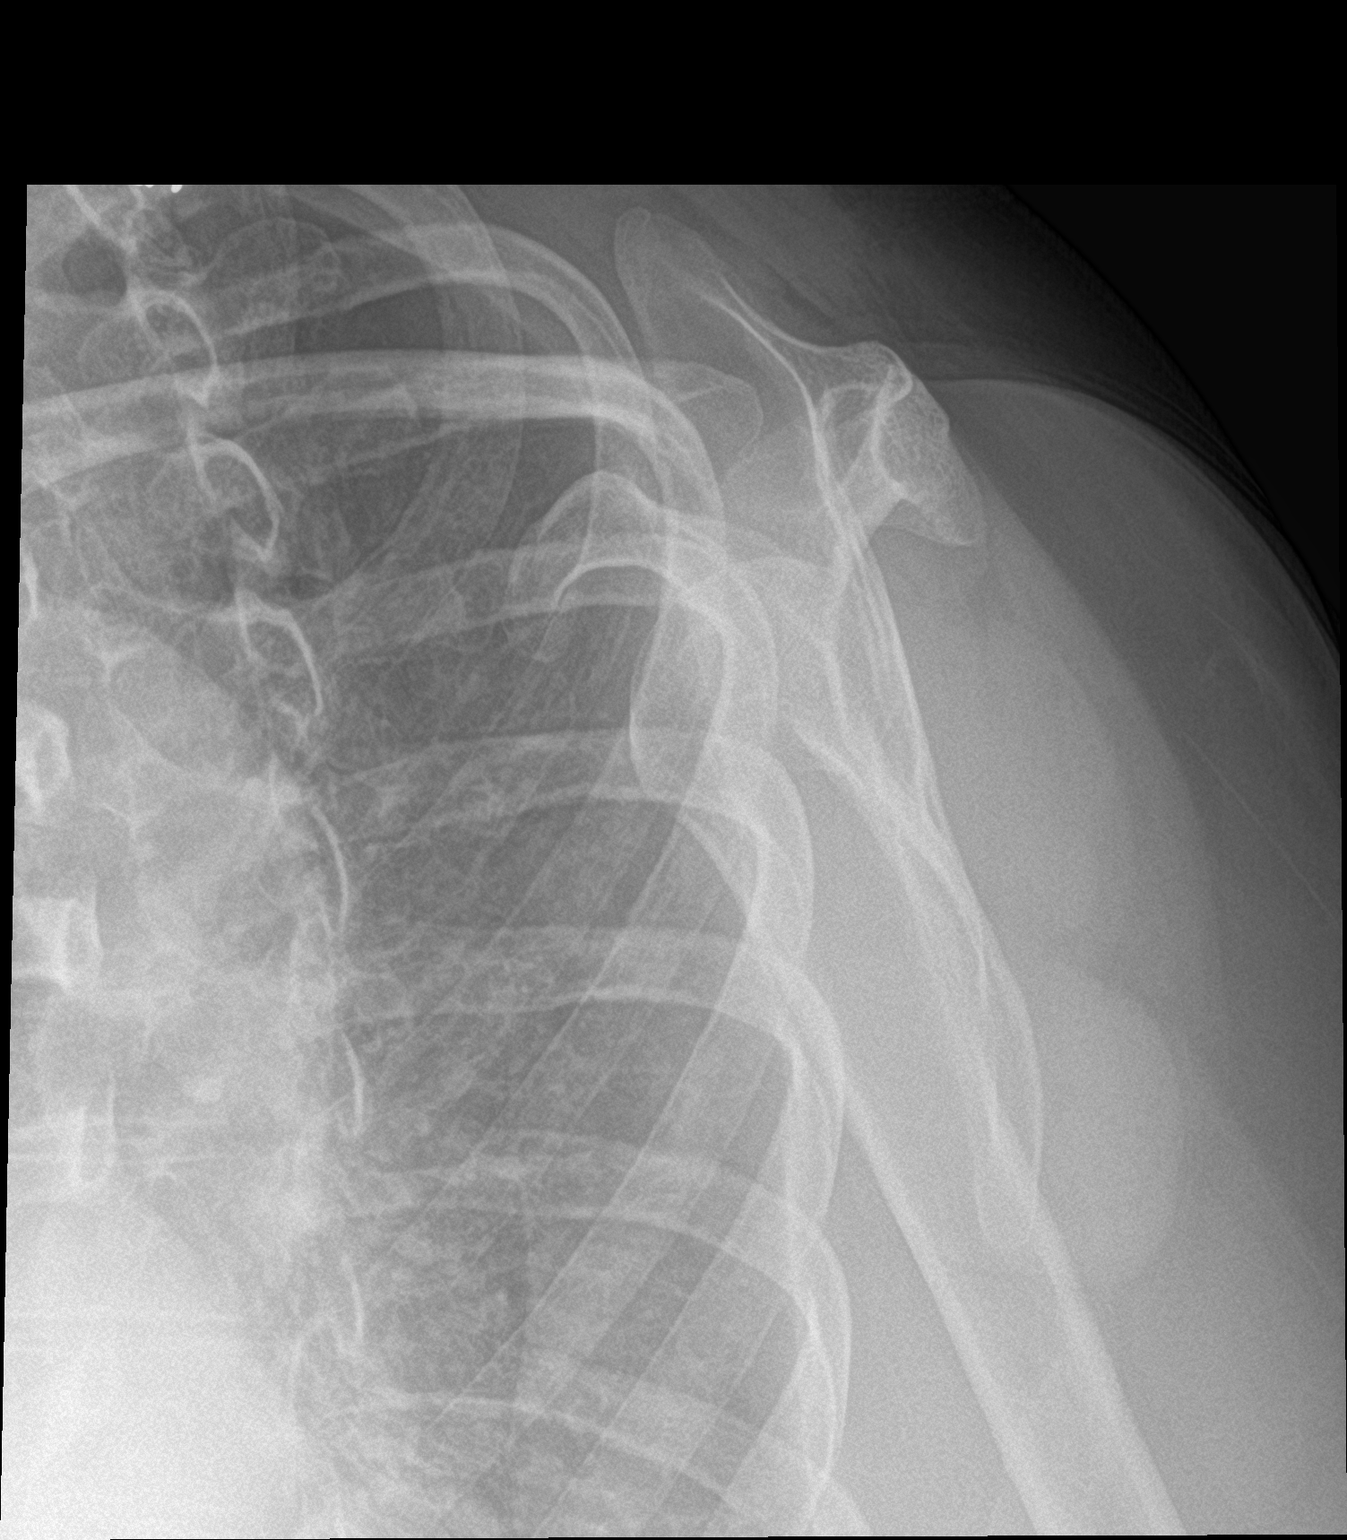

[shoulder axillary]
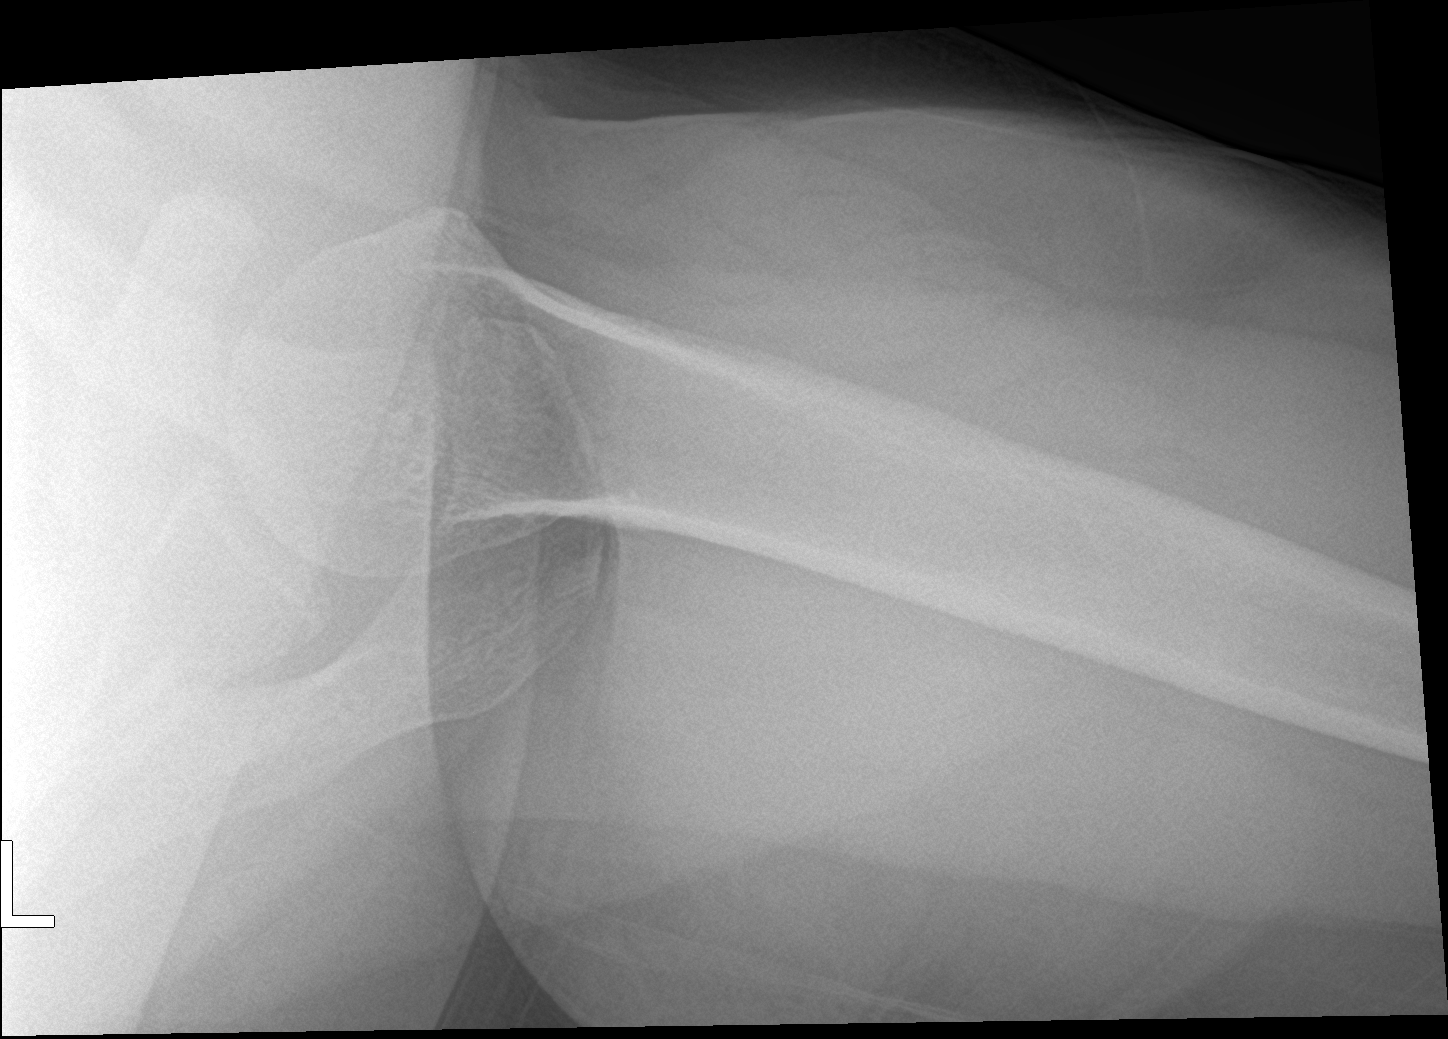

[3 of 3 positions shown; findings below may reference images not displayed]

FINDINGS: There is no evidence of fracture or dislocation. There is no
evidence of arthropathy or other focal bone abnormality. Soft
tissues are unremarkable.
IMPRESSION: Negative.

## 2018-02-14 ENCOUNTER — Other Ambulatory Visit: Payer: Self-pay

## 2018-02-14 ENCOUNTER — Encounter (HOSPITAL_COMMUNITY): Payer: Self-pay | Admitting: Emergency Medicine

## 2018-02-14 ENCOUNTER — Emergency Department (HOSPITAL_COMMUNITY)
Admission: EM | Admit: 2018-02-14 | Discharge: 2018-02-14 | Disposition: A | Discharge status: Home / Self Care | Payer: Self-pay | Attending: Emergency Medicine | Admitting: Emergency Medicine

## 2018-02-14 ENCOUNTER — Emergency Department (HOSPITAL_COMMUNITY): Payer: Self-pay

## 2018-02-14 DIAGNOSIS — Z79899 Other long term (current) drug therapy: Secondary | ICD-10-CM | POA: Insufficient documentation

## 2018-02-14 DIAGNOSIS — H66004 Acute suppurative otitis media without spontaneous rupture of ear drum, recurrent, right ear: Secondary | ICD-10-CM | POA: Insufficient documentation

## 2018-02-14 DIAGNOSIS — J45909 Unspecified asthma, uncomplicated: Secondary | ICD-10-CM | POA: Insufficient documentation

## 2018-02-14 MED ORDER — AMOXICILLIN-POT CLAVULANATE 875-125 MG PO TABS
1.0000 | ORAL_TABLET | Freq: Once | ORAL | Status: AC
  Administered 2018-02-14: 1 via ORAL
  Filled 2018-02-14: qty 1

## 2018-02-14 MED ORDER — MAGIC MOUTHWASH W/LIDOCAINE: 5.0000 mL | Freq: Three times a day (TID) | ORAL | 0 refills | Status: DC | PRN

## 2018-02-14 MED ORDER — AMOXICILLIN-POT CLAVULANATE 875-125 MG PO TABS: 1.0000 | ORAL_TABLET | Freq: Two times a day (BID) | ORAL | 0 refills | Status: DC

## 2018-02-14 NOTE — Discharge Instructions (Signed)
Ibuprofen every 6 hrs for fever and or pain.  Return here for any worsening symptoms

## 2018-02-14 NOTE — ED Triage Notes (Signed)
Pt c/o of sore throat, cough, fever, and ear pain since Saturday.  Pt took ibuprofen at 1630 today.

## 2018-02-14 NOTE — ED Provider Notes (Signed)
[facility] Provider Note   CSN: [id number] Arrival date & time: 02/14/18  1510     History   Chief Complaint Chief Complaint  Patient presents with  . Cough    HPI Bevely Palmermanda I Dillon is a 47 y.o. female.  HPI   Bevely Palmermanda I Dillon is a 47 y.o. female who presents to the Emergency Department complaining of sore throat, fever, and bilateral ear pain.  Symptoms began 3 days ago.  Initially, she is noticed pain to her right ear that is gradually worsened.  She reports history of frequent ear infections with a tympanic membrane rupture several years ago.  Fever has been reported as low-grade.  She is taking ibuprofen with some relief.  She denies significant cough, dizziness, decreased hearing or drainage from her ear, and  facial pain or swelling.  Past Medical History:  Diagnosis Date  . Anemia   . Arthritis   . Asthma   . Hypoglycemia   . IBS (irritable bowel syndrome)     Patient Active Problem List   Diagnosis Date Noted  . 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 S/P arthroscopy of right knee 06/28/2012  . 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 S/P arthroscopy of left knee 10/20/2011  . Chondromalacia of patella, right 05/19/2011  . 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 KNEE PAIN 01/13/2010  . ASTHMA 12/18/2009    Past Surgical History:  Procedure Laterality Date  . ABDOMINAL HYSTERECTOMY    . CHONDROPLASTY  09/24/2011   Procedure: CHONDROPLASTY;  Surgeon: Fuller CanadaStanley Harrison, MD;  Location: [facility];  Service: Orthopedics;  Laterality: Left;  Chondroplasty Patella  . KNEE ARTHROSCOPY  09/24/2011   Procedure: ARTHROSCOPY KNEE;  Surgeon: Fuller CanadaStanley Harrison, MD;  Location: [facility];  Service: Orthopedics;  Laterality: Left;  . KNEE SURGERY  05/2011   rtight knee scope chondroplasty patella  . MOUTH SURGERY     removal of teeth  . TUBAL LIGATION      OB History    Gravida Para Term Preterm AB Living   4         4   SAB TAB Ectopic Multiple Live Births             Home Medications    Prior to Admission medications   Medication Sig Start Date End Date Taking? Authorizing Provider  albuterol (PROVENTIL HFA;VENTOLIN HFA) 108 (90 Base) MCG/ACT inhaler Inhale 2 puffs into the lungs every 4 (four) hours as needed for wheezing or shortness of breath. 05/02/16   Nelva NayBeaton, Robert, MD  cetirizine (ZYRTEC) 10 MG tablet Take 10 mg by mouth at bedtime.     [provider]  cyclobenzaprine (FLEXERIL) 10 MG tablet Take 1 tablet (10 mg total) by mouth 3 (three) times daily. 08/27/17   Ivery QualeBryant, Hobson, PA-C  dexamethasone (DECADRON) 4 MG tablet Take 1 tablet (4 mg total) by mouth 2 (two) times daily with a meal. Patient not taking: Reported on 12/19/2016 11/09/16   Ivery QualeBryant, Hobson, PA-C  ibuprofen (ADVIL,MOTRIN) 600 MG tablet Take 1 tablet (600 mg total) by mouth 4 (four) times daily. 08/27/17   Ivery QualeBryant, Hobson, PA-C  predniSONE (STERAPRED UNI-PAK 21 TAB) 10 MG (21) TBPK tablet Take 6 tabs by mouth daily  for 2 days, then 5 tabs for 2 days, then 4 tabs for 2 days, then 3 tabs for 2 days, 2 tabs for 2 days, then 1 tab by mouth daily for 2 days 12/19/16   Jacalyn LefevreHaviland, Julie, MD    Family History Family History  Problem Relation Age of Onset  . Heart disease Unknown   . Arthritis Unknown   . Cancer Unknown   . Asthma Unknown   . Diabetes Unknown   . Anesthesia problems Neg Hx   . Hypotension Neg Hx   . Malignant hyperthermia Neg Hx   . Pseudochol deficiency Neg Hx     Social History Social History   Tobacco Use  . Smoking status: Never Smoker  . Smokeless tobacco: Never Used  Substance Use Topics  . Alcohol use: No  . Drug use: No     Allergies   Patient has no known allergies.   Review of Systems Review of Systems  Constitutional: Positive for fever. Negative for activity change, appetite change and chills.  HENT: Positive for congestion, ear pain and sore throat. Negative for ear discharge, facial swelling, hearing loss, rhinorrhea and  trouble swallowing.   Eyes: Negative for visual disturbance.  Respiratory: Negative for cough, shortness of breath, wheezing and stridor.   Gastrointestinal: Negative for abdominal pain, nausea and vomiting.  Musculoskeletal: Negative for neck pain and neck stiffness.  Skin: Negative for rash.  Neurological: Negative for dizziness, weakness, numbness and headaches.  Hematological: Negative for adenopathy.  Psychiatric/Behavioral: Negative for confusion.  All other systems reviewed and are negative.    Physical Exam Updated Vital Signs BP 121/83 (BP Location: Right Arm)   Pulse (!) 111   Temp 99.1 F (37.3 C) (Oral)   Resp 16   Ht 5\' 7"  (1.702 m)   Wt 102.1 kg (225 lb)   SpO2 100%   BMI 35.24 kg/m   Physical Exam  Constitutional: She is oriented to person, place, and time. She appears well-developed and well-nourished. No distress.  HENT:  Head: Normocephalic and atraumatic.  Right Ear: Ear canal normal. No drainage. No mastoid tenderness. Tympanic membrane is erythematous and bulging. Tympanic membrane is not perforated. No hemotympanum. No decreased hearing is noted.  Left Ear: Ear canal normal. No drainage. No mastoid tenderness. Tympanic membrane is erythematous. Tympanic membrane is not bulging. No hemotympanum. No decreased hearing is noted.  Mouth/Throat: Uvula is midline and mucous membranes are normal. No trismus in the jaw. No uvula swelling. Posterior oropharyngeal erythema present. No oropharyngeal exudate, posterior oropharyngeal edema or tonsillar abscesses. No tonsillar exudate.  Neck: Normal range of motion. Neck supple.  Cardiovascular: Normal rate, regular rhythm and normal heart sounds.  Pulmonary/Chest: Effort normal and breath sounds normal.  Abdominal: There is no splenomegaly. There is no tenderness.  Musculoskeletal: Normal range of motion.  Lymphadenopathy:    She has no cervical adenopathy.  Neurological: She is alert and oriented to person, place, and  time. She exhibits normal muscle tone. Coordination normal.  Skin: Skin is warm and dry.  Psychiatric: She has a normal mood and affect.  Nursing note and vitals reviewed.    ED Treatments / Results  Labs (all labs ordered are listed, but only abnormal results are displayed) Labs Reviewed - No data to display  EKG  EKG Interpretation None       Radiology Dg Chest 2 View  Result Date: 02/14/2018 CLINICAL DATA:  Cough with sore throat and intermittent fevers since 02/12/2018. EXAM: CHEST - 2 VIEW COMPARISON:  Single-view of the chest 12/19/2016. PA and lateral chest 03/17/2016. FINDINGS: Small focus of linear scar in the left lower lobe is unchanged. Lungs are otherwise clear. Heart size is normal. No pneumothorax or pleural effusion. No acute bony abnormality. IMPRESSION: No acute disease. Electronically Signed  By: Drusilla Kanner M.D.   On: 02/14/2018 15:47    Procedures Procedures (including critical care time)  Medications Ordered in ED Medications  amoxicillin-clavulanate (AUGMENTIN) 875-125 MG per tablet 1 tablet (1 tablet Oral Given 02/14/18 2012)     Initial Impression / Assessment and Plan / ED Course  I have reviewed the triage vital signs and the nursing notes.  Pertinent labs & imaging results that were available during my care of the patient were reviewed by me and considered in my medical decision making (see chart for details).     Pt with acute right OM.  Mild tachcardia likely from extended wait time and decreased fluid intake today.  She is otherwise well appearing.  Will tx with augmetin  Final Clinical Impressions(s) / ED Diagnoses   Final diagnoses:  Recurrent acute suppurative otitis media of right ear without spontaneous rupture of tympanic membrane    ED Discharge Orders    None       Pauline Aus, PA-C 02/15/18 0001    Bethann Berkshire, MD 02/16/18 [phone]

## 2019-01-23 ENCOUNTER — Other Ambulatory Visit: Payer: Self-pay

## 2019-01-23 ENCOUNTER — Emergency Department (HOSPITAL_COMMUNITY)
Admission: EM | Admit: 2019-01-23 | Discharge: 2019-01-23 | Disposition: A | Discharge status: Home / Self Care | Payer: Self-pay | Attending: Emergency Medicine | Admitting: Emergency Medicine

## 2019-01-23 ENCOUNTER — Emergency Department (HOSPITAL_COMMUNITY): Payer: Self-pay

## 2019-01-23 ENCOUNTER — Encounter (HOSPITAL_COMMUNITY): Payer: Self-pay | Admitting: Emergency Medicine

## 2019-01-23 DIAGNOSIS — R059 Cough, unspecified: Secondary | ICD-10-CM

## 2019-01-23 DIAGNOSIS — J320 Chronic maxillary sinusitis: Secondary | ICD-10-CM | POA: Insufficient documentation

## 2019-01-23 DIAGNOSIS — Z79899 Other long term (current) drug therapy: Secondary | ICD-10-CM | POA: Insufficient documentation

## 2019-01-23 DIAGNOSIS — J45909 Unspecified asthma, uncomplicated: Secondary | ICD-10-CM | POA: Insufficient documentation

## 2019-01-23 DIAGNOSIS — R05 Cough: Secondary | ICD-10-CM

## 2019-01-23 MED ORDER — AMOXICILLIN-POT CLAVULANATE 875-125 MG PO TABS: 1.0000 | ORAL_TABLET | Freq: Two times a day (BID) | ORAL | 0 refills | Status: DC

## 2019-01-23 MED ORDER — BENZONATATE 100 MG PO CAPS: 100.0000 mg | ORAL_CAPSULE | Freq: Three times a day (TID) | ORAL | 0 refills | Status: DC

## 2019-01-23 NOTE — ED Provider Notes (Signed)
[facility] EMERGENCY DEPARTMENT Provider Note   CSN: [id number] Arrival date & time: 01/23/19  1611     History   Chief Complaint Chief Complaint  Patient presents with  . Cough    HPI Bailey Mcdonald is a 48 y.o. female.  Patient reports sinus congestion, facial pain, cough, intermittent fever for 3 weeks.   The history is provided by the patient. No language interpreter was used.  Cough  Severity:  Moderate Duration:  3 weeks Timing:  Sporadic Progression:  Waxing and waning Chronicity:  New Ineffective treatments:  Decongestant, fluids and beta-agonist inhaler Associated symptoms: chills, fever, myalgias and sinus congestion     Past Medical History:  Diagnosis Date  . Anemia   . Arthritis   . Asthma   . Hypoglycemia   . IBS (irritable bowel syndrome)     Patient Active Problem List   Diagnosis Date Noted  . 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 S/P arthroscopy of right knee 06/28/2012  . 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 S/P arthroscopy of left knee 10/20/2011  . Chondromalacia of patella, right 05/19/2011  . 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 KNEE PAIN 01/13/2010  . ASTHMA 12/18/2009    Past Surgical History:  Procedure Laterality Date  . ABDOMINAL HYSTERECTOMY    . CHONDROPLASTY  09/24/2011   Procedure: CHONDROPLASTY;  Surgeon: Fuller CanadaStanley Harrison, MD;  Location: [facility];  Service: Orthopedics;  Laterality: Left;  Chondroplasty Patella  . KNEE ARTHROSCOPY  09/24/2011   Procedure: ARTHROSCOPY KNEE;  Surgeon: Fuller CanadaStanley Harrison, MD;  Location: [facility];  Service: Orthopedics;  Laterality: Left;  . KNEE SURGERY  05/2011   rtight knee scope chondroplasty patella  . MOUTH SURGERY     removal of teeth  . TUBAL LIGATION       OB History    Gravida  4   Para      Term      Preterm      AB      Living  4     SAB      TAB      Ectopic      Multiple      Live Births               Home Medications    Prior to Admission  medications   Medication Sig Start Date End Date Taking? Authorizing Provider  albuterol (PROVENTIL HFA;VENTOLIN HFA) 108 (90 Base) MCG/ACT inhaler Inhale 2 puffs into the lungs every 4 (four) hours as needed for wheezing or shortness of breath. 05/02/16   Nelva NayBeaton, Robert, MD  amoxicillin-clavulanate (AUGMENTIN) 875-125 MG tablet Take 1 tablet by mouth every 12 (twelve) hours. 02/14/18   Triplett, Tammy, PA-C  cetirizine (ZYRTEC) 10 MG tablet Take 10 mg by mouth at bedtime.     [provider]  cyclobenzaprine (FLEXERIL) 10 MG tablet Take 1 tablet (10 mg total) by mouth 3 (three) times daily. 08/27/17   Ivery QualeBryant, Hobson, PA-C  dexamethasone (DECADRON) 4 MG tablet Take 1 tablet (4 mg total) by mouth 2 (two) times daily with a meal. Patient not taking: Reported on 12/19/2016 11/09/16   Ivery QualeBryant, Hobson, PA-C  ibuprofen (ADVIL,MOTRIN) 600 MG tablet Take 1 tablet (600 mg total) by mouth 4 (four) times daily. 08/27/17   Ivery QualeBryant, Hobson, PA-C  magic mouthwash w/lidocaine SOLN Take 5 mLs by mouth 3 (three) times daily as needed for mouth pain. Swish and spit, do not swallow 02/14/18   Triplett, Tammy, PA-C  predniSONE (STERAPRED UNI-PAK  21 TAB) 10 MG (21) TBPK tablet Take 6 tabs by mouth daily  for 2 days, then 5 tabs for 2 days, then 4 tabs for 2 days, then 3 tabs for 2 days, 2 tabs for 2 days, then 1 tab by mouth daily for 2 days 12/19/16   Jacalyn Lefevre, MD    Family History Family History  Problem Relation Age of Onset  . Heart disease Other   . Arthritis Other   . Cancer Other   . Asthma Other   . Diabetes Other   . Anesthesia problems Neg Hx   . Hypotension Neg Hx   . Malignant hyperthermia Neg Hx   . Pseudochol deficiency Neg Hx     Social History Social History   Tobacco Use  . Smoking status: Never Smoker  . Smokeless tobacco: Never Used  Substance Use Topics  . Alcohol use: No  . Drug use: No     Allergies   Patient has no known allergies.   Review of Systems Review of  Systems  Constitutional: Positive for chills and fever.  HENT: Positive for sinus pressure and sinus pain.   Respiratory: Positive for cough.   Musculoskeletal: Positive for myalgias.  All other systems reviewed and are negative.    Physical Exam Updated Vital Signs BP (!) 143/84 (BP Location: Right Arm)   Pulse 95   Temp 98.3 F (36.8 C) (Oral)   Resp 20   Ht 5\' 8"  (1.727 m)   Wt 97.5 kg   SpO2 100%   BMI 32.69 kg/m   Physical Exam Vitals signs and nursing note reviewed.  Constitutional:      Appearance: She is not toxic-appearing.  HENT:     Head: Normocephalic.     Right Ear: Tympanic membrane normal.     Left Ear: Tympanic membrane normal.     Nose: Congestion present.     Right Sinus: Frontal sinus tenderness present.     Left Sinus: Frontal sinus tenderness present.     Mouth/Throat:     Mouth: Mucous membranes are moist.     Pharynx: Oropharynx is clear.  Eyes:     Conjunctiva/sclera: Conjunctivae normal.  Cardiovascular:     Rate and Rhythm: Normal rate and regular rhythm.  Pulmonary:     Effort: Pulmonary effort is normal.     Breath sounds: Normal breath sounds.  Abdominal:     General: Bowel sounds are normal.     Palpations: Abdomen is soft.  Musculoskeletal: Normal range of motion.  Skin:    General: Skin is warm and dry.     Findings: No rash.  Neurological:     Mental Status: She is alert and oriented to person, place, and time.  Psychiatric:        Mood and Affect: Mood normal.      ED Treatments / Results  Labs (all labs ordered are listed, but only abnormal results are displayed) Labs Reviewed - No data to display  EKG None  Radiology Dg Chest 2 View  Result Date: 01/23/2019 CLINICAL DATA:  Cough with yellow sputum for 3 weeks EXAM: CHEST - 2 VIEW COMPARISON:  February 14, 2018 FINDINGS: The heart size and mediastinal contours are within normal limits. Both lungs are clear. The visualized skeletal structures are unremarkable.  IMPRESSION: No active cardiopulmonary disease. Electronically Signed   By: Sherian Rein M.D.   On: 01/23/2019 18:39    Procedures Procedures (including critical care time)  Medications Ordered in ED Medications -  No data to display   Initial Impression / Assessment and Plan / ED Course  I have reviewed the triage vital signs and the nursing notes.  Pertinent labs & imaging results that were available during my care of the patient were reviewed by me and considered in my medical decision making (see chart for details).     Pt symptoms consistent with URI and sinusitis. CXR negative for acute infiltrate. Pt will be discharged with symptomatic treatment for cough. Augmentin for sinusitis.  Discussed return precautions.  Pt is hemodynamically stable & in NAD prior to discharge.  Final Clinical Impressions(s) / ED Diagnoses   Final diagnoses:  Cough  Maxillary sinusitis, unspecified chronicity    ED Discharge Orders         Ordered    amoxicillin-clavulanate (AUGMENTIN) 875-125 MG tablet  2 times daily     01/23/19 1854    benzonatate (TESSALON) 100 MG capsule  Every 8 hours     01/23/19 1854           Felicie MornSmith, Aleigha Gilani, NP 01/23/19 1908    Sabas SousBero, Michael M, MD 01/24/19 0028

## 2019-01-23 NOTE — ED Triage Notes (Signed)
Patient complaining of cough and congestion x 3 weeks. States today "it feels like it's hard to get enough air. I have asthma but my inhaler isn't working."

## 2019-03-20 ENCOUNTER — Emergency Department (HOSPITAL_COMMUNITY): Payer: Self-pay

## 2019-03-20 ENCOUNTER — Emergency Department (HOSPITAL_COMMUNITY)
Admission: EM | Admit: 2019-03-20 | Discharge: 2019-03-21 | Disposition: A | Discharge status: Home / Self Care | Payer: Self-pay | Attending: Emergency Medicine | Admitting: Emergency Medicine

## 2019-03-20 ENCOUNTER — Encounter (HOSPITAL_COMMUNITY): Payer: Self-pay | Admitting: *Deleted

## 2019-03-20 ENCOUNTER — Other Ambulatory Visit: Payer: Self-pay

## 2019-03-20 DIAGNOSIS — Z79899 Other long term (current) drug therapy: Secondary | ICD-10-CM | POA: Insufficient documentation

## 2019-03-20 DIAGNOSIS — R109 Unspecified abdominal pain: Secondary | ICD-10-CM

## 2019-03-20 DIAGNOSIS — J45909 Unspecified asthma, uncomplicated: Secondary | ICD-10-CM | POA: Insufficient documentation

## 2019-03-20 DIAGNOSIS — N390 Urinary tract infection, site not specified: Secondary | ICD-10-CM | POA: Insufficient documentation

## 2019-03-20 HISTORY — DX: Disorder of kidney and ureter, unspecified: N28.9

## 2019-03-20 LAB — URINALYSIS, ROUTINE W REFLEX MICROSCOPIC
Bilirubin Urine: NEGATIVE
Glucose, UA: NEGATIVE mg/dL
Ketones, ur: NEGATIVE mg/dL
Nitrite: NEGATIVE
Protein, ur: 30 mg/dL — AB
Specific Gravity, Urine: 1.017 (ref 1.005–1.030)
WBC, UA: >50 WBC/hpf — ABNORMAL HIGH (ref 0–5)
pH: 5 (ref 5.0–8.0)

## 2019-03-20 MED ORDER — SODIUM CHLORIDE 0.9 % IV SOLN
1.0000 g | Freq: Once | INTRAVENOUS | Status: AC
  Administered 2019-03-20: 1 g via INTRAVENOUS
  Filled 2019-03-20: qty 10

## 2019-03-20 MED ORDER — ONDANSETRON HCL 4 MG/2ML IJ SOLN
4.0000 mg | Freq: Once | INTRAMUSCULAR | Status: AC
  Administered 2019-03-20: 22:00:00 4 mg via INTRAVENOUS
  Filled 2019-03-20: qty 2

## 2019-03-20 MED ORDER — HYDROCODONE-ACETAMINOPHEN 5-325 MG PO TABS: 1.0000 | ORAL_TABLET | ORAL | 0 refills | Status: DC | PRN

## 2019-03-20 MED ORDER — KETOROLAC TROMETHAMINE 30 MG/ML IJ SOLN
30.0000 mg | Freq: Once | INTRAMUSCULAR | Status: AC
  Administered 2019-03-20: 30 mg via INTRAVENOUS
  Filled 2019-03-20: qty 1

## 2019-03-20 MED ORDER — CEPHALEXIN 500 MG PO CAPS: 500.0000 mg | ORAL_CAPSULE | Freq: Four times a day (QID) | ORAL | 0 refills | Status: DC

## 2019-03-20 NOTE — ED Triage Notes (Signed)
Pt c/o right side flank pain that radiates to right lower abd area

## 2019-03-20 NOTE — ED Notes (Signed)
Gave patient  

## 2019-03-20 NOTE — Discharge Instructions (Addendum)
Your urine analysis shows evidence of urinary tract infection.  Your CT scan does not show evidence of a stone.  There are some changes of the ureter, the tube from your kidney to your bladder that question urinary tract infection versus a stone that is already passed.  Please use Keflex with breakfast, lunch, dinner, and at bedtime.  Please increase fluids.  Please use Tylenol extra strength for mild pain, use Norco for more severe pain.  Please see your primary physician or return to the emergency department if any high fever, worsening of your symptoms, changes in your condition, problems, or concerns.

## 2019-03-20 NOTE — ED Notes (Signed)
Gave patient ice water as requested.  

## 2019-03-20 NOTE — ED Provider Notes (Signed)
[facility] EMERGENCY DEPARTMENT Provider Note   CSN: [id number] Arrival date & time: 03/20/19  2037    History   Chief Complaint Chief Complaint  Patient presents with  . Flank Pain    HPI Bailey Mcdonald is a 48 y.o. female.     Patient is a 48 year old female who presents to the emergency department with a complaint of right-sided flank pain.  The patient states this problem started on yesterday, April 6.  The pain is been getting progressively worse during the day.  The patient denies any blood in the urine.  She has not had any constipation or diarrhea.  No vaginal discharge, no vaginal bleeding.  There is been no recent cough or fever.  No recent injury to the back or flank area.  The patient states at times it feels as though the pain is radiating from the right flank to the lower abdomen area.  She says it feels similar to a kidney stone she had approximately 9 years ago.  She presents now for assistance with this issue.  The history is provided by the patient.  Flank Pain  Pertinent negatives include no chest pain, no abdominal pain and no shortness of breath.    Past Medical History:  Diagnosis Date  . Anemia   . Arthritis   . Asthma   . Hypoglycemia   . IBS (irritable bowel syndrome)   . Renal disorder     Patient Active Problem List   Diagnosis Date Noted  . 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 S/P arthroscopy of right knee 06/28/2012  . 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 S/P arthroscopy of left knee 10/20/2011  . Chondromalacia of patella, right 05/19/2011  . 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 KNEE PAIN 01/13/2010  . ASTHMA 12/18/2009    Past Surgical History:  Procedure Laterality Date  . ABDOMINAL HYSTERECTOMY    . CHONDROPLASTY  09/24/2011   Procedure: CHONDROPLASTY;  Surgeon: [name], MD;  Location: [facility];  Service: Orthopedics;  Laterality: Left;  Chondroplasty Patella  . KNEE ARTHROSCOPY  09/24/2011   Procedure: ARTHROSCOPY  KNEE;  Surgeon: [name], MD;  Location: [facility];  Service: Orthopedics;  Laterality: Left;  . KNEE SURGERY  05/2011   rtight knee scope chondroplasty patella  . MOUTH SURGERY     removal of teeth  . TUBAL LIGATION       OB History    Gravida  4   Para      Term      Preterm      AB      Living  4     SAB      TAB      Ectopic      Multiple      Live Births               Home Medications    Prior to Admission medications   Medication Sig Start Date End Date Taking? Authorizing Provider  albuterol (PROVENTIL HFA;VENTOLIN HFA) 108 (90 Base) MCG/ACT inhaler Inhale 1-2 puffs into the lungs every 6 (six) hours as needed for wheezing or shortness of breath.   Yes [provider]  ibuprofen (ADVIL,MOTRIN) 200 MG tablet Take 400 mg by mouth every 6 (six) hours as needed for mild pain or moderate pain.   Yes [provider]  amoxicillin-clavulanate (AUGMENTIN) 875-125 MG tablet Take 1 tablet by mouth 2 (two) times daily. One po bid x 7 days Patient not taking: Reported on  03/20/2019 01/23/19   Felicie MornSmith, David, NP  benzonatate (TESSALON) 100 MG capsule Take 1 capsule (100 mg total) by mouth every 8 (eight) hours. Patient not taking: Reported on 03/20/2019 01/23/19   Felicie MornSmith, David, NP    Family History Family History  Problem Relation Age of Onset  . Heart disease Other   . Arthritis Other   . Cancer Other   . Asthma Other   . Diabetes Other   . Anesthesia problems Neg Hx   . Hypotension Neg Hx   . Malignant hyperthermia Neg Hx   . Pseudochol deficiency Neg Hx     Social History Social History   Tobacco Use  . Smoking status: Never Smoker  . Smokeless tobacco: Never Used  Substance Use Topics  . Alcohol use: No  . Drug use: No     Allergies   Patient has no known allergies.   Review of Systems Review of Systems  Constitutional: Positive for fever. Negative for activity change.       All ROS Neg except as noted in HPI  HENT:  Negative for nosebleeds.   Eyes: Negative for photophobia and discharge.  Respiratory: Negative for cough, shortness of breath and wheezing.   Cardiovascular: Negative for chest pain and palpitations.  Gastrointestinal: Positive for nausea. Negative for abdominal pain and blood in stool.  Genitourinary: Positive for flank pain. Negative for dysuria, frequency and hematuria.  Musculoskeletal: Negative for arthralgias, back pain and neck pain.  Skin: Negative.   Neurological: Negative for dizziness, seizures and speech difficulty.  Psychiatric/Behavioral: Negative for confusion and hallucinations.     Physical Exam Updated Vital Signs BP (!) 151/115   Pulse (!) 101   Temp 99.2 F (37.3 C) (Oral)   Resp 16   Ht 5\' 8"  (1.727 m)   Wt 102.1 kg   SpO2 100%   BMI 34.21 kg/m   Physical Exam Vitals signs and nursing note reviewed.  Constitutional:      Appearance: She is well-developed. She is not toxic-appearing.  HENT:     Head: Normocephalic.     Right Ear: Tympanic membrane and external ear normal.     Left Ear: Tympanic membrane and external ear normal.  Eyes:     General: Lids are normal.     Pupils: Pupils are equal, round, and reactive to light.  Neck:     Musculoskeletal: Normal range of motion and neck supple.     Vascular: No carotid bruit.  Cardiovascular:     Rate and Rhythm: Normal rate and regular rhythm.     Pulses: Normal pulses.     Heart sounds: Normal heart sounds.  Pulmonary:     Effort: No respiratory distress.     Breath sounds: Normal breath sounds.  Abdominal:     General: Bowel sounds are normal. There is no distension.     Palpations: Abdomen is soft.     Tenderness: There is no abdominal tenderness. There is right CVA tenderness. There is no guarding.  Musculoskeletal: Normal range of motion.  Lymphadenopathy:     Head:     Right side of head: No submandibular adenopathy.     Left side of head: No submandibular adenopathy.     Cervical: No  cervical adenopathy.  Skin:    General: Skin is warm and dry.  Neurological:     Mental Status: She is alert and oriented to person, place, and time.     Cranial Nerves: No cranial nerve deficit.  Sensory: No sensory deficit.  Psychiatric:        Speech: Speech normal.      ED Treatments / Results  Labs (all labs ordered are listed, but only abnormal results are displayed) Labs Reviewed  URINALYSIS, ROUTINE W REFLEX MICROSCOPIC    EKG None  Radiology No results found.  Procedures Procedures (including critical care time)  Medications Ordered in ED Medications  ketorolac (TORADOL) 30 MG/ML injection 30 mg (has no administration in time range)  ondansetron (ZOFRAN) injection 4 mg (has no administration in time range)     Initial Impression / Assessment and Plan / ED Course  I have reviewed the triage vital signs and the nursing notes.  Pertinent labs & imaging results that were available during my care of the patient were reviewed by me and considered in my medical decision making (see chart for details).          Final Clinical Impressions(s) / ED Diagnoses MDM Pulse rate is elevated at 101, blood pressure is elevated at 151/115.  The pulse oximetry is 100% on room air.  Within normal limits by my interpretation.  The patient states this pain feels similar to a kidney stone that she had approximately 9 years ago.  No recent injury or trauma.  The patient denies any excessive lifting, pushing, or pulling.  There is been no diarrhea reported.  No unusual constipation.                               Patient is treated in the emergency department with IV Toradol.  Urine analysis and CT stone study pending.  Urine analysis shows a hazy yellow specimen with a specific gravity 1.017.  There is moderate hemoglobin on the dipstick, large leukocyte esterase 21-50 red blood cells and greater than 50 white blood cells.  The patient was treated with IV Rocephin.  The CT  stone study shows no renal or ureteral stones.  However there was noted significant peri-nephrotic edema.  There was question as to whether or not this was related to a urinary tract infection, or a stone that was already passed.  The patient will be on Keflex 4 times daily.  She will use Tylenol every 4 hours for discomfort.  She will use Norco for more severe pain.  Have asked her to return to the emergency department if pain is not resolving, high fever, vomiting or back pain, changes in her condition, problems, or concerns.   Final diagnoses:  Lower urinary tract infectious disease  Flank pain    ED Discharge Orders         Ordered    cephALEXin (KEFLEX) 500 MG capsule  4 times daily     03/20/19 2350    HYDROcodone-acetaminophen (NORCO/VICODIN) 5-325 MG tablet  Every 4 hours PRN     03/20/19 2350           Ivery Quale, PA-C 03/21/19 0018    Vanetta Mulders, MD 03/21/19 1818

## 2019-03-23 LAB — URINE CULTURE: Culture: >=100000 — AB

## 2019-08-01 ENCOUNTER — Other Ambulatory Visit (HOSPITAL_COMMUNITY): Payer: Self-pay | Admitting: Physician Assistant

## 2019-08-01 DIAGNOSIS — Z1231 Encounter for screening mammogram for malignant neoplasm of breast: Secondary | ICD-10-CM

## 2020-01-15 DIAGNOSIS — R519 Headache, unspecified: Secondary | ICD-10-CM | POA: Diagnosis not present

## 2020-01-15 DIAGNOSIS — Z681 Body mass index (BMI) 19 or less, adult: Secondary | ICD-10-CM | POA: Diagnosis not present

## 2020-01-15 DIAGNOSIS — J22 Unspecified acute lower respiratory infection: Secondary | ICD-10-CM | POA: Diagnosis not present

## 2020-02-26 ENCOUNTER — Emergency Department (HOSPITAL_COMMUNITY)
Admission: EM | Admit: 2020-02-26 | Discharge: 2020-02-26 | Disposition: A | Discharge status: Home / Self Care | Payer: BC Managed Care – PPO | Attending: Emergency Medicine | Admitting: Emergency Medicine

## 2020-02-26 ENCOUNTER — Emergency Department (HOSPITAL_COMMUNITY): Payer: BC Managed Care – PPO

## 2020-02-26 ENCOUNTER — Other Ambulatory Visit: Payer: Self-pay

## 2020-02-26 ENCOUNTER — Encounter (HOSPITAL_COMMUNITY): Payer: Self-pay | Admitting: *Deleted

## 2020-02-26 DIAGNOSIS — R7989 Other specified abnormal findings of blood chemistry: Secondary | ICD-10-CM | POA: Diagnosis not present

## 2020-02-26 DIAGNOSIS — Z79899 Other long term (current) drug therapy: Secondary | ICD-10-CM | POA: Insufficient documentation

## 2020-02-26 DIAGNOSIS — J45909 Unspecified asthma, uncomplicated: Secondary | ICD-10-CM | POA: Insufficient documentation

## 2020-02-26 DIAGNOSIS — R0602 Shortness of breath: Secondary | ICD-10-CM | POA: Diagnosis not present

## 2020-02-26 DIAGNOSIS — R0789 Other chest pain: Secondary | ICD-10-CM | POA: Diagnosis not present

## 2020-02-26 DIAGNOSIS — R42 Dizziness and giddiness: Secondary | ICD-10-CM | POA: Diagnosis not present

## 2020-02-26 DIAGNOSIS — R000 Tachycardia, unspecified: Secondary | ICD-10-CM | POA: Diagnosis not present

## 2020-02-26 DIAGNOSIS — R079 Chest pain, unspecified: Secondary | ICD-10-CM | POA: Insufficient documentation

## 2020-02-26 LAB — CBC WITH DIFFERENTIAL/PLATELET
Abs Immature Granulocytes: 0.03 10*3/uL (ref 0.00–0.07)
Basophils Absolute: 0.1 10*3/uL (ref 0.0–0.1)
Basophils Relative: 1 %
Eosinophils Absolute: 0.3 10*3/uL (ref 0.0–0.5)
Eosinophils Relative: 4 %
HCT: 45.8 % (ref 36.0–46.0)
Hemoglobin: 15.4 g/dL — ABNORMAL HIGH (ref 12.0–15.0)
Immature Granulocytes: 0 %
Lymphocytes Relative: 18 %
Lymphs Abs: 1.5 10*3/uL (ref 0.7–4.0)
MCH: 30.2 pg (ref 26.0–34.0)
MCHC: 33.6 g/dL (ref 30.0–36.0)
MCV: 89.8 fL (ref 80.0–100.0)
Monocytes Absolute: 0.4 10*3/uL (ref 0.1–1.0)
Monocytes Relative: 5 %
Neutro Abs: 6.1 10*3/uL (ref 1.7–7.7)
Neutrophils Relative %: 72 %
Platelets: 324 10*3/uL (ref 150–400)
RBC: 5.1 MIL/uL (ref 3.87–5.11)
RDW: 12.2 % (ref 11.5–15.5)
WBC: 8.4 10*3/uL (ref 4.0–10.5)
nRBC: 0 % (ref 0.0–0.2)

## 2020-02-26 LAB — BASIC METABOLIC PANEL
Anion gap: 7 (ref 5–15)
BUN: 10 mg/dL (ref 6–20)
CO2: 26 mmol/L (ref 22–32)
Calcium: 9.5 mg/dL (ref 8.9–10.3)
Chloride: 106 mmol/L (ref 98–111)
Creatinine, Ser: 0.75 mg/dL (ref 0.44–1.00)
GFR calc Af Amer: >60 mL/min (ref >60)
GFR calc non Af Amer: >60 mL/min (ref >60)
Glucose, Bld: 108 mg/dL — ABNORMAL HIGH (ref 70–99)
Potassium: 3.8 mmol/L (ref 3.5–5.1)
Sodium: 139 mmol/L (ref 135–145)

## 2020-02-26 LAB — TROPONIN I (HIGH SENSITIVITY)
Troponin I (High Sensitivity): <2 ng/L (ref <18)
Troponin I (High Sensitivity): <2 ng/L (ref <18)

## 2020-02-26 LAB — D-DIMER, QUANTITATIVE: D-Dimer, Quant: 0.54 ug/mL-FEU — ABNORMAL HIGH (ref 0.00–0.50)

## 2020-02-26 MED ORDER — IOHEXOL 350 MG/ML SOLN
100.0000 mL | Freq: Once | INTRAVENOUS | Status: AC | PRN
  Administered 2020-02-26: 100 mL via INTRAVENOUS

## 2020-02-26 NOTE — Discharge Instructions (Signed)
Your work-up today was reassuring, chest pain could be from anxiety or musculoskeletal, you can take Tylenol and ibuprofen as needed.  Please follow-up with your primary care doctor if pain continues.  If you develop worsening chest pain, shortness of breath, feel lightheaded or any other new or concerning symptoms develop return to the ED.

## 2020-02-26 NOTE — ED Provider Notes (Addendum)
[facility] EMERGENCY DEPARTMENT Provider Note   CSN: [id number] Arrival date & time: 02/26/20  1728     History Chief Complaint  Patient presents with  . Chest Pain    Bailey Mcdonald is a 49 y.o. female.  Bailey Mcdonald is a 49 y.o. female with a history of asthma, anemia, arthritis, and IBS, who presents to the emergency department for evaluation of chest pain.  She states over the past 5 days she has been getting intermittent pains in the center of her chest.  She reports that it starts as a sharp pain in the middle of her chest that then radiates out to both sides.  She reports the pain lasts a few seconds but happens multiple times throughout the day.  She reports today the pain started happening more frequently, 3-4 times an hour, and so she got concerned, called her PCP who directed her here.  Pain is not worse with exertion, she does state that her chest feels tight when pain comes on and she feels little shortness of breath.  She denies any lightheadedness or syncope.  No associated epigastric pain, vomiting or diaphoresis.  No smoking history, denies alcohol or drug use.  Denies any history of heart disease, no history of blood clots.  No recent long distance travel or surgery, no lower extremity pain or swelling.  Patient does have a history of asthma which she reports has been flaring up recently but she has never had pain like this with her asthma before.        Past Medical History:  Diagnosis Date  . Anemia   . Arthritis   . Asthma   . Hypoglycemia   . IBS (irritable bowel syndrome)   . Renal disorder     Patient Active Problem List   Diagnosis Date Noted  . 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 S/P arthroscopy of right knee 06/28/2012  . 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 S/P arthroscopy of left knee 10/20/2011  . Chondromalacia of patella, right 05/19/2011  . 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 KNEE PAIN 01/13/2010  . ASTHMA 12/18/2009    Past  Surgical History:  Procedure Laterality Date  . ABDOMINAL HYSTERECTOMY    . CHONDROPLASTY  09/24/2011   Procedure: CHONDROPLASTY;  Surgeon: [name], MD;  Location: [facility];  Service: Orthopedics;  Laterality: Left;  Chondroplasty Patella  . KNEE ARTHROSCOPY  09/24/2011   Procedure: ARTHROSCOPY KNEE;  Surgeon: [name], MD;  Location: [facility];  Service: Orthopedics;  Laterality: Left;  . KNEE SURGERY  05/2011   rtight knee scope chondroplasty patella  . MOUTH SURGERY     removal of teeth  . TUBAL LIGATION       OB History    Gravida  4   Para      Term      Preterm      AB      Living  4     SAB      TAB      Ectopic      Multiple      Live Births              Family History  Problem Relation Age of Onset  . Heart disease Other   . Arthritis Other   . Cancer Other   . Asthma Other   . Diabetes Other   . Anesthesia problems Neg Hx   . Hypotension Neg Hx   . Malignant hyperthermia Neg Hx   .  Pseudochol deficiency Neg Hx     Social History   Tobacco Use  . Smoking status: Never Smoker  . Smokeless tobacco: Never Used  Substance Use Topics  . Alcohol use: No  . Drug use: No    Home Medications Prior to Admission medications   Medication Sig Start Date End Date Taking? Authorizing Provider  albuterol (PROVENTIL HFA;VENTOLIN HFA) 108 (90 Base) MCG/ACT inhaler Inhale 1-2 puffs into the lungs every 6 (six) hours as needed for wheezing or shortness of breath.   Yes [provider]  ibuprofen (ADVIL,MOTRIN) 200 MG tablet Take 400 mg by mouth every 6 (six) hours as needed for mild pain or moderate pain.   Yes [provider]  montelukast (SINGULAIR) 10 MG tablet Take 10 mg by mouth daily. 12/28/19  Yes [provider]  zolpidem (AMBIEN) 10 MG tablet Take 10 mg by mouth at bedtime as needed. 02/08/20  Yes [provider]    Allergies    Patient has no known allergies.  Review of Systems   Review of  Systems  Constitutional: Negative for chills and fever.  HENT: Negative.   Respiratory: Positive for shortness of breath. Negative for cough and wheezing.   Cardiovascular: Positive for chest pain. Negative for palpitations and leg swelling.  Gastrointestinal: Negative for abdominal pain, diarrhea, nausea and vomiting.  Genitourinary: Negative for dysuria and frequency.  Musculoskeletal: Negative for arthralgias and myalgias.  Skin: Negative for color change and rash.  Neurological: Negative for dizziness, syncope and light-headedness.  All other systems reviewed and are negative.   Physical Exam Updated Vital Signs BP 136/73   Pulse 97   Temp 98.5 F (36.9 C)   Resp 16   Ht 5\' 8"  (1.727 m)   SpO2 100%   BMI 34.21 kg/m   Physical Exam Vitals and nursing note reviewed.  Constitutional:      General: She is not in acute distress.    Appearance: She is well-developed. She is obese. She is not ill-appearing or diaphoretic.  HENT:     Head: Normocephalic and atraumatic.  Eyes:     General:        Right eye: No discharge.        Left eye: No discharge.     Pupils: Pupils are equal, round, and reactive to light.  Cardiovascular:     Rate and Rhythm: Normal rate and regular rhythm.     Pulses:          Radial pulses are 2+ on the right side and 2+ on the left side.     Heart sounds: Normal heart sounds. No murmur. No friction rub. No gallop.   Pulmonary:     Effort: Pulmonary effort is normal. No respiratory distress.     Breath sounds: Normal breath sounds. No wheezing or rales.     Comments: Respirations equal and unlabored, patient able to speak in full sentences, lungs clear to auscultation bilaterally Chest:     Chest wall: No tenderness.     Comments: Pain is not reproducible with palpation over the chest wall Abdominal:     General: Bowel sounds are normal. There is no distension.     Palpations: Abdomen is soft. There is no mass.     Tenderness: There is no  abdominal tenderness. There is no guarding.  Musculoskeletal:        General: No deformity.     Cervical back: Neck supple.     Right lower leg: No tenderness.  No edema.     Left lower leg: No tenderness. No edema.  Skin:    General: Skin is warm and dry.     Capillary Refill: Capillary refill takes less than 2 seconds.  Neurological:     Mental Status: She is alert.     Coordination: Coordination normal.     Comments: Speech is clear, able to follow commands Moves extremities without ataxia, coordination intact  Psychiatric:        Mood and Affect: Mood normal.        Behavior: Behavior normal.     ED Results / Procedures / Treatments   Labs (all labs ordered are listed, but only abnormal results are displayed) Labs Reviewed  BASIC METABOLIC PANEL - Abnormal; Notable for the following components:      Result Value   Glucose, Bld 108 (*)    All other components within normal limits  CBC WITH DIFFERENTIAL/PLATELET - Abnormal; Notable for the following components:   Hemoglobin 15.4 (*)    All other components within normal limits  D-DIMER, QUANTITATIVE (NOT AT [facility]) - Abnormal; Notable for the following components:   D-Dimer, Quant 0.54 (*)    All other components within normal limits  TROPONIN I (HIGH SENSITIVITY)  TROPONIN I (HIGH SENSITIVITY)    EKG EKG Interpretation  Date/Time:  Tuesday February 26 2020 18:01:06 EDT Ventricular Rate:  94 PR Interval:    QRS Duration: 93 QT Interval:  353 QTC Calculation: 442 R Axis:   -8 Text Interpretation: Sinus rhythm Low voltage, precordial leads Confirmed by Marianna Fuss ([id number]) on 02/26/2020 7:34:38 PM   Radiology CT Angio Chest PE W and/or Wo Contrast  Result Date: 02/26/2020 CLINICAL DATA:  PE suspected, positive D-dimer. EXAM: CT ANGIOGRAPHY CHEST WITH CONTRAST TECHNIQUE: Multidetector CT imaging of the chest was performed using the standard protocol during bolus administration of intravenous contrast. Multiplanar CT  image reconstructions and MIPs were obtained to evaluate the vascular anatomy. CONTRAST:  OMNIPAQUE IOHEXOL 350 MG/ML SOLN COMPARISON:  None. FINDINGS: Cardiovascular: No filling defects within the pulmonary arteries to suggest acute pulmonary embolism. Mediastinum/Nodes: No axillary supraclavicular adenopathy. No mediastinal hilar adenopathy. No pericardial effusion. Esophagus normal. Lungs/Pleura: No pulmonary infarction. No airspace disease. No suspicious nodularity. Airways normal. Small focus of atelectasis in the inferior lingula (image 86/8). Upper Abdomen: Limited view of the liver, kidneys, pancreas are unremarkable. Normal adrenal glands. Musculoskeletal: No acute osseous abnormality. Review of the MIP images confirms the above findings. IMPRESSION: 1. No evidence acute pulmonary embolism. 2. No evidence of pulmonary infarction or pulmonary infection Electronically Signed   By: Genevive Bi M.D.   On: 02/26/2020 20:20   DG Chest Port 1 View  Result Date: 02/26/2020 CLINICAL DATA:  49 year old female with chest pain. EXAM: PORTABLE CHEST 1 VIEW COMPARISON:  Chest radiograph dated 01/23/2019. FINDINGS: No focal consolidation, pleural effusion, pneumothorax. Minimal left lung base scarring. The cardiac silhouette is within normal limits. No acute osseous pathology. IMPRESSION: No active disease. Electronically Signed   By: Elgie Collard M.D.   On: 02/26/2020 19:31    Procedures Procedures (including critical care time)  Medications Ordered in ED Medications  iohexol (OMNIPAQUE) 350 MG/ML injection 100 mL (100 mLs Intravenous Contrast Given 02/26/20 1952)    ED Course  I have reviewed the triage vital signs and the nursing notes.  Pertinent labs & imaging results that were available during my care of the patient were reviewed by me and considered in my medical decision making (see  chart for details).    MDM Rules/Calculators/A&P                      Patient presents to the  emergency department with chest pain. Patient nontoxic appearing, in no apparent distress, vitals without significant abnormality. Fairly benign physical exam.   Prior heart catheterization reviewed:  Patient's primary cardiologist:   DDX: ACS, pulmonary embolism, dissection, pneumothorax, pneumonia, arrhythmia, severe anemia, MSK, GERD, anxiety. Evaluation initiated with labs, EKG, and CXR. Patient on cardiac monitor.   CBC: No leukocytosis, slightly elevated hemoglobin BMP: Glucose of 1 week, no other electrolyte derangements, normal renal function Troponin: Negative x2 D-dimer: elevated will get CTA EKG: NSR without ischemic changes CXR: Negative, without infiltrate, effusion, pneumothorax, or fracture/dislocation.   [place] Pathway Score 2 - EKG without obvious acute ischemia, delta troponin negative, doubt ACS.  D-dimer elevated, CTA ordered to rule out PE, which was negative. Pain is not a tearing sensation, symmetric pulses, no widening of mediastinum on CXR, doubt dissection. Cardiac monitor reviewed, no notable arrhythmias or tachycardia. Patient has appeared hemodynamically stable throughout ER visit and appears safe for discharge with close PCP/cardiology follow up. I discussed results, treatment plan, need for PCP follow-up, and return precautions with the patient. Provided opportunity for questions, patient confirmed understanding and is in agreement with plan.   Final Clinical Impression(s) / ED Diagnoses Final diagnoses:  Central chest pain    Rx / DC Orders ED Discharge Orders    None       Janet Berlin 02/26/20 2133    Jacqlyn Larsen, PA-C 02/26/20 2137    Lucrezia Starch, MD 02/26/20 [phone]

## 2020-02-26 NOTE — ED Triage Notes (Signed)
Chest pain for 5 days, states her PCP advised her to come in for evaluation

## 2020-03-03 DIAGNOSIS — F419 Anxiety disorder, unspecified: Secondary | ICD-10-CM | POA: Diagnosis not present

## 2020-03-05 ENCOUNTER — Ambulatory Visit: Payer: BC Managed Care – PPO | Admitting: Orthopedic Surgery

## 2020-03-05 ENCOUNTER — Encounter: Payer: Self-pay | Admitting: Orthopedic Surgery

## 2020-03-05 ENCOUNTER — Ambulatory Visit: Payer: BC Managed Care – PPO

## 2020-03-05 ENCOUNTER — Other Ambulatory Visit: Payer: Self-pay

## 2020-03-05 VITALS — BP 148/98 | HR 81 | Ht 68.0 in | Wt 220.0 lb

## 2020-03-05 DIAGNOSIS — M25562 Pain in left knee: Secondary | ICD-10-CM | POA: Diagnosis not present

## 2020-03-05 DIAGNOSIS — M25561 Pain in right knee: Secondary | ICD-10-CM | POA: Diagnosis not present

## 2020-03-05 DIAGNOSIS — G8929 Other chronic pain: Secondary | ICD-10-CM

## 2020-03-05 MED ORDER — MELOXICAM 7.5 MG PO TABS: 7.5000 mg | ORAL_TABLET | Freq: Every day | ORAL | 5 refills | Status: DC

## 2020-03-05 NOTE — Progress Notes (Signed)
[name]  03/05/2020  Body mass index is 33.45 kg/m.   HISTORY SECTION :  Chief Complaint  Patient presents with  . Knee Pain    left greater htan right knee pain    49 year old female bilateral knee pain status post bilateral knee surgeries in 2012 presents for evaluation of right and left knee pain left greater than right with catching sensation are related to range of motion of the knee especially in the patellofemoral joint.  She also has burning pain on the medial side of the knees when standing     Review of Systems  Musculoskeletal: Positive for back pain and joint pain.  All other systems reviewed and are negative.    has a past medical history of Anemia, Arthritis, Asthma, Hypoglycemia, IBS (irritable bowel syndrome), and Renal disorder.   Past Surgical History:  Procedure Laterality Date  . ABDOMINAL HYSTERECTOMY    . CHONDROPLASTY  09/24/2011   Procedure: CHONDROPLASTY;  Surgeon: [name], MD;  Location: [facility];  Service: Orthopedics;  Laterality: Left;  Chondroplasty Patella  . KNEE ARTHROSCOPY  09/24/2011   Procedure: ARTHROSCOPY KNEE;  Surgeon: [name], MD;  Location: [facility];  Service: Orthopedics;  Laterality: Left;  . KNEE SURGERY  05/2011   right knee scope chondroplasty patella/ also has had left knee scope 2012  . MOUTH SURGERY     removal of teeth  . TUBAL LIGATION      Body mass index is 33.45 kg/m.   No Known Allergies   Current Outpatient Medications:  .  albuterol (PROVENTIL HFA;VENTOLIN HFA) 108 (90 Base) MCG/ACT inhaler, Inhale 1-2 puffs into the lungs every 6 (six) hours as needed for wheezing or shortness of breath., Disp: , Rfl:  .  ibuprofen (ADVIL,MOTRIN) 200 MG tablet, Take 400 mg by mouth every 6 (six) hours as needed for mild pain or moderate pain., Disp: , Rfl:  .  montelukast (SINGULAIR) 10 MG tablet, Take 10 mg by mouth daily., Disp: , Rfl:  .  zolpidem (AMBIEN) 10 MG tablet, Take 10 mg by mouth at bedtime  as needed., Disp: , Rfl:  .  escitalopram (LEXAPRO) 10 MG tablet, Take 10 mg by mouth daily., Disp: , Rfl:  .  meloxicam (MOBIC) 7.5 MG tablet, Take 1 tablet (7.5 mg total) by mouth daily., Disp: 30 tablet, Rfl: 5   PHYSICAL EXAM SECTION: 1) BP (!) 148/98   Pulse 81   Ht 5\' 8"  (1.727 m)   Wt 220 lb (99.8 kg)   BMI 33.45 kg/m   Body mass index is 33.45 kg/m. General appearance: Well-developed well-nourished no gross deformities  2) Cardiovascular normal pulse and perfusion , normal color   3) Neurologically deep tendon reflexes are equal and normal, no sensation loss or deficits no pathologic reflexes  4) Psychological: Awake alert and oriented x3 mood and affect normal  5) Skin no lacerations or ulcerations no nodularity no palpable masses, no erythema or nodularity  6) Musculoskeletal:   Skin normal both knees No tenderness to palpation either knee no effusion but crepitance in both patella.  Right knee no catching left knee catches at about mid flexion both knees stable muscle tone normal both knees     MEDICAL DECISION MAKING  A.  Encounter Diagnosis  Name Primary?  . Bilateral chronic knee pain Yes    B. DATA ANALYSED:  IMAGING: Independent interpretation of images: 2 series of images both knees x3 see report x-rays show mild narrowing medial compartment patella centered  with the right side actually showing more lateral traction osteophyte   Orders: Physical therapy  Outside records reviewed: No  C. MANAGEMENT  Physical therapy anti-inflammatories  Meds ordered this encounter  Medications  . meloxicam (MOBIC) 7.5 MG tablet    Sig: Take 1 tablet (7.5 mg total) by mouth daily.    Dispense:  30 tablet    Refill:  5     Follow-up 3 months   Arther Abbott, MD  03/05/2020 2:12 PM

## 2020-03-05 NOTE — Patient Instructions (Addendum)
Start PT    Patellofemoral Pain Syndrome  Patellofemoral pain syndrome is a condition in which the tissue (cartilage) on the underside of the kneecap (patella) softens or breaks down. This causes pain in the front of the knee. The condition is also called runner's knee or chondromalacia patella. Patellofemoral pain syndrome is most common in young adults who are active in sports. The knee is the largest joint in the body. The patella covers the front of the knee and is attached to muscles above and below the knee. The underside of the patella is covered with a smooth type of cartilage (synovium). The smooth surface helps the patella to glide easily when you move your knee. Patellofemoral pain syndrome causes swelling in the joint linings and bone surfaces in the knee. What are the causes? This condition may be caused by:  Overuse of the knee.  Poor alignment of your knee joints.  Weak leg muscles.  A direct blow to your kneecap. What increases the risk? You are more likely to develop this condition if:  You do a lot of activities that can wear down your kneecap. These include: ? Running. ? Squatting. ? Climbing stairs.  You start a new physical activity or exercise program.  You wear shoes that do not fit well.  You do not have good leg strength.  You are overweight. What are the signs or symptoms? The main symptom of this condition is knee pain. This may feel like a dull, aching pain underneath your patella, in the front of your knee. There may be a popping or cracking sound when you move your knee. Pain may get worse with:  Exercise.  Climbing stairs.  Running.  Jumping.  Squatting.  Kneeling.  Sitting for a long time.  Moving or pushing on your patella. How is this diagnosed? This condition may be diagnosed based on:  Your symptoms and medical history. You may be asked about your recent physical activities and which ones cause knee pain.  A physical exam.  This may include: ? Moving your patella back and forth. ? Checking your range of knee motion. ? Having you squat or jump to see if you have pain. ? Checking the strength of your leg muscles.  Imaging tests to confirm the diagnosis. These may include an MRI of your knee. How is this treated? This condition may be treated at home with rest, ice, compression, and elevation (RICE).  Other treatments may include:  Nonsteroidal anti-inflammatory drugs (NSAIDs).  Physical therapy to stretch and strengthen your leg muscles.  Shoe inserts (orthotics) to take stress off your knee.  A knee brace or knee support.  Adhesive tapes to the skin.  Surgery to remove damaged cartilage or move the patella to a better position. This is rare. Follow these instructions at home: If you have a shoe or brace:  Wear the shoe or brace as told by your health care provider. Remove it only as told by your health care provider.  Loosen the shoe or brace if your toes tingle, become numb, or turn cold and blue.  Keep the shoe or brace clean.  If the shoe or brace is not waterproof: ? Do not let it get wet. ? Cover it with a watertight covering when you take a bath or a shower. Managing pain, stiffness, and swelling  If directed, put ice on the painful area. ? If you have a removable shoe or brace, remove it as told by your health care provider. ? Put  ice in a plastic bag. ? Place a towel between your skin and the bag. ? Leave the ice on for 20 minutes, 2-3 times a day.  Move your toes often to avoid stiffness and to lessen swelling.  Rest your knee: ? Avoid activities that cause knee pain. ? When sitting or lying down, raise (elevate) the injured area above the level of your heart, whenever possible. General instructions  Take over-the-counter and prescription medicines only as told by your health care provider.  Use splints, braces, knee supports, or walking aids as directed by your health care  provider.  Perform stretching and strengthening exercises as told by your health care provider or physical therapist.  Do not use any products that contain nicotine or tobacco, such as cigarettes and e-cigarettes. These can delay healing. If you need help quitting, ask your health care provider.  Return to your normal activities as told by your health care provider. Ask your health care provider what activities are safe for you.  Keep all follow-up visits as told by your health care provider. This is important. Contact a health care provider if:  Your symptoms get worse.  You are not improving with home care. Summary  Patellofemoral pain syndrome is a condition in which the tissue (cartilage) on the underside of the kneecap (patella) softens or breaks down.  This condition causes swelling in the joint linings and bone surfaces in the knee. This leads to pain in the front of the knee.  This condition may be treated at home with rest, ice, compression, and elevation (RICE).  Use splints, braces, knee supports, or walking aids as directed by your health care provider. This information is not intended to replace advice given to you by your health care provider. Make sure you discuss any questions you have with your health care provider. Document Revised: 01/09/2018 Document Reviewed: 01/09/2018 Elsevier Patient Education  2020 [employer].   meloxicam start

## 2020-05-23 ENCOUNTER — Encounter: Payer: Self-pay | Admitting: Orthopedic Surgery

## 2020-06-11 ENCOUNTER — Ambulatory Visit: Payer: BC Managed Care – PPO | Admitting: Orthopedic Surgery

## 2020-09-08 ENCOUNTER — Other Ambulatory Visit: Payer: Self-pay | Admitting: Orthopedic Surgery

## 2020-09-08 DIAGNOSIS — G8929 Other chronic pain: Secondary | ICD-10-CM

## 2020-09-15 ENCOUNTER — Telehealth: Payer: Self-pay | Admitting: Orthopedic Surgery

## 2020-09-15 DIAGNOSIS — M25561 Pain in right knee: Secondary | ICD-10-CM

## 2020-09-15 MED ORDER — MELOXICAM 7.5 MG PO TABS: 7.5000 mg | ORAL_TABLET | Freq: Every day | ORAL | 0 refills | Status: DC

## 2020-09-15 NOTE — Telephone Encounter (Signed)
Call received from [facility], per Lorenzo, following up on refill request for medication: meloxicam (MOBIC) 7.5 MG tablet 30 tablet  -I verified [facility] ph# [phone] and address:  [address], [place], [place]*  *states "we are the patient's new pharmacy  - appears this medication may have been already refilled through pharmacy on file, [place] in [place] ?

## 2020-09-15 NOTE — Telephone Encounter (Signed)
Resent to [facility]

## 2020-11-10 ENCOUNTER — Other Ambulatory Visit: Payer: Self-pay | Admitting: Radiology

## 2020-11-10 DIAGNOSIS — G8929 Other chronic pain: Secondary | ICD-10-CM

## 2020-11-10 MED ORDER — MELOXICAM 7.5 MG PO TABS: 7.5000 mg | ORAL_TABLET | Freq: Every day | ORAL | 0 refills | Status: DC

## 2020-11-10 NOTE — Telephone Encounter (Signed)
Refill request received via fax for   Meloxicam to [facility]

## 2021-01-15 DIAGNOSIS — Z1331 Encounter for screening for depression: Secondary | ICD-10-CM | POA: Diagnosis not present

## 2021-01-15 DIAGNOSIS — Z681 Body mass index (BMI) 19 or less, adult: Secondary | ICD-10-CM | POA: Diagnosis not present

## 2021-01-15 DIAGNOSIS — M7711 Lateral epicondylitis, right elbow: Secondary | ICD-10-CM | POA: Diagnosis not present

## 2021-10-20 ENCOUNTER — Other Ambulatory Visit: Payer: Self-pay | Admitting: Orthopedic Surgery

## 2021-10-20 DIAGNOSIS — M25561 Pain in right knee: Secondary | ICD-10-CM

## 2021-10-20 DIAGNOSIS — G8929 Other chronic pain: Secondary | ICD-10-CM

## 2021-10-22 ENCOUNTER — Other Ambulatory Visit: Payer: Self-pay | Admitting: Radiology

## 2021-10-22 DIAGNOSIS — M25561 Pain in right knee: Secondary | ICD-10-CM

## 2021-10-22 DIAGNOSIS — G8929 Other chronic pain: Secondary | ICD-10-CM

## 2021-10-22 MED ORDER — MELOXICAM 7.5 MG PO TABS: 7.5000 mg | ORAL_TABLET | Freq: Every day | ORAL | 2 refills | Status: DC

## 2021-10-22 NOTE — Telephone Encounter (Signed)
Refill request received via fax for  Meloxicam to [facility]

## 2021-10-26 ENCOUNTER — Other Ambulatory Visit: Payer: Self-pay | Admitting: Radiology

## 2021-10-26 DIAGNOSIS — G8929 Other chronic pain: Secondary | ICD-10-CM

## 2021-10-26 DIAGNOSIS — M25561 Pain in right knee: Secondary | ICD-10-CM

## 2021-10-26 MED ORDER — MELOXICAM 7.5 MG PO TABS: 7.5000 mg | ORAL_TABLET | Freq: Every day | ORAL | 2 refills | Status: AC

## 2021-10-26 NOTE — Telephone Encounter (Signed)
[facility] called LM asking for refill for patient.  Meloxican 7.5 mg.

## 2022-07-30 ENCOUNTER — Ambulatory Visit (HOSPITAL_COMMUNITY)
Admission: RE | Admit: 2022-07-30 | Discharge: 2022-07-30 | Disposition: A | Discharge status: Home / Self Care | Payer: BC Managed Care – PPO | Source: Ambulatory Visit | Attending: Family Medicine | Admitting: Family Medicine

## 2022-07-30 ENCOUNTER — Other Ambulatory Visit (HOSPITAL_COMMUNITY): Payer: Self-pay | Admitting: Family Medicine

## 2022-07-30 DIAGNOSIS — M79672 Pain in left foot: Secondary | ICD-10-CM | POA: Insufficient documentation

## 2022-07-30 DIAGNOSIS — M79675 Pain in left toe(s): Secondary | ICD-10-CM | POA: Insufficient documentation

## 2023-02-10 ENCOUNTER — Encounter: Payer: Self-pay | Admitting: Radiology
